# Patient Record
Sex: Male | Born: 1945
Health system: Southern US, Community
[De-identification: ages and names within clinical notes are randomized; demographics above are authoritative.]

## PROBLEM LIST (undated history)

## (undated) DIAGNOSIS — G47419 Narcolepsy without cataplexy: Secondary | ICD-10-CM

## (undated) DIAGNOSIS — J449 Chronic obstructive pulmonary disease, unspecified: Secondary | ICD-10-CM

## (undated) DIAGNOSIS — I10 Essential (primary) hypertension: Secondary | ICD-10-CM

## (undated) DIAGNOSIS — F4310 Post-traumatic stress disorder, unspecified: Secondary | ICD-10-CM

## (undated) DIAGNOSIS — R7303 Prediabetes: Secondary | ICD-10-CM

## (undated) DIAGNOSIS — J439 Emphysema, unspecified: Secondary | ICD-10-CM

## (undated) DIAGNOSIS — G629 Polyneuropathy, unspecified: Secondary | ICD-10-CM

## (undated) DIAGNOSIS — M1990 Unspecified osteoarthritis, unspecified site: Secondary | ICD-10-CM

## (undated) DIAGNOSIS — L409 Psoriasis, unspecified: Secondary | ICD-10-CM

## (undated) DIAGNOSIS — M51369 Other intervertebral disc degeneration, lumbar region without mention of lumbar back pain or lower extremity pain: Secondary | ICD-10-CM

## (undated) DIAGNOSIS — R519 Headache, unspecified: Secondary | ICD-10-CM

## (undated) DIAGNOSIS — R739 Hyperglycemia, unspecified: Secondary | ICD-10-CM

## (undated) DIAGNOSIS — J189 Pneumonia, unspecified organism: Secondary | ICD-10-CM

## (undated) DIAGNOSIS — E785 Hyperlipidemia, unspecified: Secondary | ICD-10-CM

## (undated) DIAGNOSIS — H269 Unspecified cataract: Secondary | ICD-10-CM

## (undated) DIAGNOSIS — R51 Headache: Secondary | ICD-10-CM

## (undated) DIAGNOSIS — M5136 Other intervertebral disc degeneration, lumbar region: Secondary | ICD-10-CM

## (undated) DIAGNOSIS — I77811 Abdominal aortic ectasia: Secondary | ICD-10-CM

## (undated) DIAGNOSIS — Z77098 Contact with and (suspected) exposure to other hazardous, chiefly nonmedicinal, chemicals: Secondary | ICD-10-CM

## (undated) DIAGNOSIS — I2510 Atherosclerotic heart disease of native coronary artery without angina pectoris: Secondary | ICD-10-CM

## (undated) DIAGNOSIS — K219 Gastro-esophageal reflux disease without esophagitis: Secondary | ICD-10-CM

## (undated) DIAGNOSIS — D7589 Other specified diseases of blood and blood-forming organs: Secondary | ICD-10-CM

## (undated) DIAGNOSIS — N400 Enlarged prostate without lower urinary tract symptoms: Secondary | ICD-10-CM

## (undated) HISTORY — DX: Atherosclerotic heart disease of native coronary artery without angina pectoris: I25.10

## (undated) HISTORY — DX: Post-traumatic stress disorder, unspecified: F43.10

## (undated) HISTORY — PX: REPLACEMENT TOTAL KNEE BILATERAL: SUR1225

## (undated) HISTORY — DX: Other specified diseases of blood and blood-forming organs: D75.89

## (undated) HISTORY — DX: Polyneuropathy, unspecified: G62.9

## (undated) HISTORY — DX: Gastro-esophageal reflux disease without esophagitis: K21.9

## (undated) HISTORY — DX: Other intervertebral disc degeneration, lumbar region: M51.36

## (undated) HISTORY — PX: COLONOSCOPY: SHX174

## (undated) HISTORY — PX: BACK SURGERY: SHX140

## (undated) HISTORY — DX: Hyperglycemia, unspecified: R73.9

## (undated) HISTORY — DX: Psoriasis, unspecified: L40.9

## (undated) HISTORY — DX: Other intervertebral disc degeneration, lumbar region without mention of lumbar back pain or lower extremity pain: M51.369

## (undated) HISTORY — DX: Unspecified osteoarthritis, unspecified site: M19.90

## (undated) HISTORY — PX: TONSILLECTOMY: SUR1361

## (undated) HISTORY — DX: Emphysema, unspecified: J43.9

## (undated) HISTORY — DX: Benign prostatic hyperplasia without lower urinary tract symptoms: N40.0

## (undated) HISTORY — DX: Unspecified cataract: H26.9

---

## 2002-03-20 ENCOUNTER — Encounter (INDEPENDENT_AMBULATORY_CARE_PROVIDER_SITE_OTHER): Payer: Self-pay | Admitting: Specialist

## 2002-03-20 ENCOUNTER — Ambulatory Visit (HOSPITAL_COMMUNITY): Admission: RE | Admit: 2002-03-20 | Discharge: 2002-03-20 | Payer: Self-pay | Admitting: *Deleted

## 2003-03-19 ENCOUNTER — Encounter: Admission: RE | Admit: 2003-03-19 | Discharge: 2003-03-23 | Payer: Self-pay | Admitting: Otolaryngology

## 2009-06-15 ENCOUNTER — Encounter: Admission: RE | Admit: 2009-06-15 | Discharge: 2009-06-15 | Payer: Self-pay | Admitting: Family Medicine

## 2009-06-29 ENCOUNTER — Ambulatory Visit: Payer: Self-pay | Admitting: Internal Medicine

## 2009-06-30 ENCOUNTER — Encounter: Payer: Self-pay | Admitting: Pulmonary Disease

## 2009-08-09 ENCOUNTER — Inpatient Hospital Stay (HOSPITAL_COMMUNITY): Admission: RE | Admit: 2009-08-09 | Discharge: 2009-08-10 | Payer: Self-pay | Admitting: Orthopedic Surgery

## 2011-01-09 ENCOUNTER — Inpatient Hospital Stay (HOSPITAL_COMMUNITY)
Admission: RE | Admit: 2011-01-09 | Discharge: 2011-01-11 | Payer: Self-pay | Source: Home / Self Care | Attending: Orthopedic Surgery | Admitting: Orthopedic Surgery

## 2011-01-09 LAB — COMPREHENSIVE METABOLIC PANEL
ALT: 36 U/L (ref 0–53)
AST: 29 U/L (ref 0–37)
Albumin: 4 g/dL (ref 3.5–5.2)
Alkaline Phosphatase: 79 U/L (ref 39–117)
BUN: 11 mg/dL (ref 6–23)
CO2: 26 mEq/L (ref 19–32)
Calcium: 9.4 mg/dL (ref 8.4–10.5)
Chloride: 109 mEq/L (ref 96–112)
Creatinine, Ser: 1.01 mg/dL (ref 0.4–1.5)
GFR calc Af Amer: >60 mL/min (ref >60)
GFR calc non Af Amer: >60 mL/min (ref >60)
Glucose, Bld: 121 mg/dL — ABNORMAL HIGH (ref 70–99)
Potassium: 4.4 mEq/L (ref 3.5–5.1)
Sodium: 141 mEq/L (ref 135–145)
Total Bilirubin: 0.5 mg/dL (ref 0.3–1.2)
Total Protein: 6.4 g/dL (ref 6.0–8.3)

## 2011-01-09 LAB — URINALYSIS, ROUTINE W REFLEX MICROSCOPIC
Bilirubin Urine: NEGATIVE
Hgb urine dipstick: NEGATIVE
Ketones, ur: NEGATIVE mg/dL
Nitrite: NEGATIVE
Protein, ur: NEGATIVE mg/dL
Specific Gravity, Urine: 1.022 (ref 1.005–1.030)
Urine Glucose, Fasting: NEGATIVE mg/dL
Urobilinogen, UA: 0.2 mg/dL (ref 0.0–1.0)
pH: 5.5 (ref 5.0–8.0)

## 2011-01-09 LAB — PROTIME-INR
INR: 0.95 (ref 0.00–1.49)
Prothrombin Time: 12.9 seconds (ref 11.6–15.2)

## 2011-01-09 LAB — DIFFERENTIAL
Basophils Absolute: 0 10*3/uL (ref 0.0–0.1)
Basophils Relative: 0 % (ref 0–1)
Eosinophils Absolute: 0.1 10*3/uL (ref 0.0–0.7)
Eosinophils Relative: 2 % (ref 0–5)
Lymphocytes Relative: 46 % (ref 12–46)
Lymphs Abs: 2.6 10*3/uL (ref 0.7–4.0)
Monocytes Absolute: 0.5 10*3/uL (ref 0.1–1.0)
Monocytes Relative: 10 % (ref 3–12)
Neutro Abs: 2.3 10*3/uL (ref 1.7–7.7)
Neutrophils Relative %: 42 % — ABNORMAL LOW (ref 43–77)

## 2011-01-09 LAB — CBC
HCT: 44.7 % (ref 39.0–52.0)
Hemoglobin: 15.6 g/dL (ref 13.0–17.0)
MCH: 34.2 pg — ABNORMAL HIGH (ref 26.0–34.0)
MCHC: 34.9 g/dL (ref 30.0–36.0)
MCV: 98 fL (ref 78.0–100.0)
Platelets: 172 10*3/uL (ref 150–400)
RBC: 4.56 MIL/uL (ref 4.22–5.81)
RDW: 12.6 % (ref 11.5–15.5)
WBC: 5.6 10*3/uL (ref 4.0–10.5)

## 2011-01-09 LAB — APTT: aPTT: 27 seconds (ref 24–37)

## 2011-01-09 LAB — SURGICAL PCR SCREEN
MRSA, PCR: NEGATIVE
Staphylococcus aureus: NEGATIVE

## 2011-01-10 ENCOUNTER — Encounter (INDEPENDENT_AMBULATORY_CARE_PROVIDER_SITE_OTHER): Payer: Self-pay | Admitting: Orthopedic Surgery

## 2011-01-11 LAB — CROSSMATCH
ABO/RH(D): O POS
Antibody Screen: NEGATIVE
Unit division: 0
Unit division: 0

## 2011-01-11 LAB — BASIC METABOLIC PANEL
BUN: 13 mg/dL (ref 6–23)
CO2: 26 mEq/L (ref 19–32)
Calcium: 8.2 mg/dL — ABNORMAL LOW (ref 8.4–10.5)
Chloride: 106 mEq/L (ref 96–112)
Creatinine, Ser: 1.04 mg/dL (ref 0.4–1.5)
GFR calc Af Amer: >60 mL/min (ref >60)
GFR calc non Af Amer: >60 mL/min (ref >60)
Glucose, Bld: 150 mg/dL — ABNORMAL HIGH (ref 70–99)
Potassium: 4.3 mEq/L (ref 3.5–5.1)
Sodium: 136 mEq/L (ref 135–145)

## 2011-01-11 LAB — URINALYSIS, ROUTINE W REFLEX MICROSCOPIC
Bilirubin Urine: NEGATIVE
Hgb urine dipstick: NEGATIVE
Ketones, ur: NEGATIVE mg/dL
Nitrite: NEGATIVE
Protein, ur: NEGATIVE mg/dL
Specific Gravity, Urine: 1.005 (ref 1.005–1.030)
Urine Glucose, Fasting: NEGATIVE mg/dL
Urobilinogen, UA: 0.2 mg/dL (ref 0.0–1.0)
pH: 6.5 (ref 5.0–8.0)

## 2011-01-11 LAB — CBC
HCT: 33.5 % — ABNORMAL LOW (ref 39.0–52.0)
Hemoglobin: 11.5 g/dL — ABNORMAL LOW (ref 13.0–17.0)
MCH: 33.7 pg (ref 26.0–34.0)
MCHC: 34.3 g/dL (ref 30.0–36.0)
MCV: 98.2 fL (ref 78.0–100.0)
Platelets: 151 10*3/uL (ref 150–400)
RBC: 3.41 MIL/uL — ABNORMAL LOW (ref 4.22–5.81)
RDW: 12.7 % (ref 11.5–15.5)
WBC: 6.5 10*3/uL (ref 4.0–10.5)

## 2011-01-16 LAB — CBC
HCT: 33.5 % — ABNORMAL LOW (ref 39.0–52.0)
Hemoglobin: 11.3 g/dL — ABNORMAL LOW (ref 13.0–17.0)
MCH: 33.2 pg (ref 26.0–34.0)
MCHC: 33.7 g/dL (ref 30.0–36.0)
MCV: 98.5 fL (ref 78.0–100.0)
Platelets: 147 10*3/uL — ABNORMAL LOW (ref 150–400)
RBC: 3.4 MIL/uL — ABNORMAL LOW (ref 4.22–5.81)
RDW: 12.6 % (ref 11.5–15.5)
WBC: 7.1 10*3/uL (ref 4.0–10.5)

## 2011-01-16 LAB — BASIC METABOLIC PANEL
BUN: 9 mg/dL (ref 6–23)
CO2: 25 mEq/L (ref 19–32)
Calcium: 8.4 mg/dL (ref 8.4–10.5)
Chloride: 108 mEq/L (ref 96–112)
Creatinine, Ser: 1.09 mg/dL (ref 0.4–1.5)
GFR calc Af Amer: >60 mL/min (ref >60)
GFR calc non Af Amer: >60 mL/min (ref >60)
Glucose, Bld: 151 mg/dL — ABNORMAL HIGH (ref 70–99)
Potassium: 4.3 mEq/L (ref 3.5–5.1)
Sodium: 140 mEq/L (ref 135–145)

## 2011-02-14 NOTE — Op Note (Signed)
  Joseph Decker, Joseph Decker                 ACCOUNT NO.:  [id number]  MEDICAL RECORD NO.:  [id number]          PATIENT TYPE:  INP  LOCATION:  5041                         FACILITY:  [facility]  PHYSICIAN:  Mila Homer. Sherlean Foot, M.D. DATE OF BIRTH:  Apr 25, 1946  DATE OF PROCEDURE:  01/09/2011 DATE OF DISCHARGE:                              OPERATIVE REPORT   SURGEON:  Mila Homer. Sherlean Foot, MD  ASSISTANT:  Altamese Cabal, PA-C and Skip Mayer, PA-C  ANESTHESIA:  General.  PREOPERATIVE DIAGNOSIS:  Left knee osteoarthritis.  POSTOPERATIVE DIAGNOSIS:  Left knee osteoarthritis.  PROCEDURE:  Left total knee arthroplasty.  INDICATIONS FOR PROCEDURE:  The patient is a 65 year old white male with failure of conservative measures for osteoarthritis of the knee. Informed consent was obtained.  DESCRIPTION OF PROCEDURE:  The patient was laid supine, administered general anesthesia.  Left leg was prepped and draped in the usual sterile fashion.  Midline incision was made with a #10 blade.  New blade was used to make a median parapatellar arthrotomy performed a synovectomy.  I then elevated the medial crest of the tibia, #10 blade used to elevate the MCL on the medial crest of the tibia around to the semimembranosus tendon.  I then used the extramedullary alignment system on the tibia to make perpendicular cut the anatomic axis of the tibia and 3 degrees posterior slope.  I resurfaced the patella with a clamp to fall recreating the 24-mm native thickness.  Drill and 3 lug holes for a 32-mm button.  Used the intramedullary system on the femur with a 3- degrees rotation, stenting the formal cutting block, placed in the anterior and posterior chamfer cuts.  I then placed the lamina alignment spreader in the knee and removed the ACL, PCL, medial and lateral menisci, and posterior condylar osteophytes.  I then chose a size 12.  I then finished the femur with a size E.  The femur with a size 5 and trialed with a 5  tibia E femur, 12 insert, 32 patella, had good flexion, extension, gap balance.  I then removed the trial components and copiously irrigated.  I then cemented in the components allowing the cement to harden in extension.  A left Hemovac coming out superolaterally and deep to the arthrotomy. Pain catheter coming out superomedially and superficially to the arthrotomy.  I let tourniquet down, obtained hemostasis, and copiously irrigated.  I then closed the arthrotomy with figure-of-eight #1 Vicryl sutures, buried with 0 Vicryl sutures and subcuticular with 2-0 Vicryl stitches.  I dressed with Xeroform dressing, sponges, sterile Webril, TED stocking.  COMPLICATIONS:  None.  DRAINS:  None.          ______________________________ Mila Homer. Sherlean Foot, M.D.     SDL/MEDQ  D:  01/09/2011  T:  01/10/2011  Job:  [id number]  Electronically Signed by Georgena Spurling M.D. on 02/14/2011 09:00:57 PM

## 2011-03-02 ENCOUNTER — Other Ambulatory Visit: Payer: Self-pay | Admitting: Orthopedic Surgery

## 2011-03-02 DIAGNOSIS — M545 Low back pain, unspecified: Secondary | ICD-10-CM

## 2011-03-07 ENCOUNTER — Ambulatory Visit
Admission: RE | Admit: 2011-03-07 | Discharge: 2011-03-07 | Disposition: A | Discharge status: Home / Self Care | Payer: Managed Care, Other (non HMO) | Source: Ambulatory Visit | Attending: Orthopedic Surgery | Admitting: Orthopedic Surgery

## 2011-03-07 DIAGNOSIS — M545 Low back pain: Secondary | ICD-10-CM

## 2011-03-08 ENCOUNTER — Other Ambulatory Visit: Payer: Self-pay | Admitting: Orthopedic Surgery

## 2011-03-08 DIAGNOSIS — M545 Low back pain: Secondary | ICD-10-CM

## 2011-03-09 ENCOUNTER — Ambulatory Visit
Admission: RE | Admit: 2011-03-09 | Discharge: 2011-03-09 | Disposition: A | Discharge status: Home / Self Care | Payer: Managed Care, Other (non HMO) | Source: Ambulatory Visit | Attending: Orthopedic Surgery | Admitting: Orthopedic Surgery

## 2011-03-09 DIAGNOSIS — M545 Low back pain: Secondary | ICD-10-CM

## 2011-03-16 ENCOUNTER — Other Ambulatory Visit: Payer: Managed Care, Other (non HMO)

## 2011-03-21 ENCOUNTER — Encounter (HOSPITAL_COMMUNITY)
Admission: RE | Admit: 2011-03-21 | Discharge: 2011-03-21 | Disposition: A | Discharge status: Home / Self Care | Payer: Managed Care, Other (non HMO) | Source: Ambulatory Visit | Attending: Neurosurgery | Admitting: Neurosurgery

## 2011-03-21 DIAGNOSIS — Z01812 Encounter for preprocedural laboratory examination: Secondary | ICD-10-CM | POA: Insufficient documentation

## 2011-03-21 LAB — CBC
HCT: 41.5 % (ref 39.0–52.0)
Hemoglobin: 14.6 g/dL (ref 13.0–17.0)
MCH: 33.6 pg (ref 26.0–34.0)
MCHC: 35.2 g/dL (ref 30.0–36.0)
MCV: 95.6 fL (ref 78.0–100.0)
Platelets: 214 10*3/uL (ref 150–400)
RBC: 4.34 MIL/uL (ref 4.22–5.81)
RDW: 12.2 % (ref 11.5–15.5)
WBC: 7.3 10*3/uL (ref 4.0–10.5)

## 2011-03-21 LAB — SURGICAL PCR SCREEN
MRSA, PCR: NEGATIVE
Staphylococcus aureus: NEGATIVE

## 2011-03-23 ENCOUNTER — Ambulatory Visit (HOSPITAL_COMMUNITY)
Admission: RE | Admit: 2011-03-23 | Discharge: 2011-03-24 | DRG: 491 | Disposition: A | Discharge status: Home / Self Care | Payer: Managed Care, Other (non HMO) | Source: Ambulatory Visit | Attending: Neurosurgery | Admitting: Neurosurgery

## 2011-03-23 ENCOUNTER — Inpatient Hospital Stay (HOSPITAL_COMMUNITY): Payer: Managed Care, Other (non HMO)

## 2011-03-23 DIAGNOSIS — M5126 Other intervertebral disc displacement, lumbar region: Secondary | ICD-10-CM | POA: Insufficient documentation

## 2011-03-23 DIAGNOSIS — M5137 Other intervertebral disc degeneration, lumbosacral region: Secondary | ICD-10-CM | POA: Insufficient documentation

## 2011-03-23 DIAGNOSIS — M47817 Spondylosis without myelopathy or radiculopathy, lumbosacral region: Secondary | ICD-10-CM | POA: Insufficient documentation

## 2011-03-23 DIAGNOSIS — M51379 Other intervertebral disc degeneration, lumbosacral region without mention of lumbar back pain or lower extremity pain: Secondary | ICD-10-CM | POA: Insufficient documentation

## 2011-03-23 DIAGNOSIS — K219 Gastro-esophageal reflux disease without esophagitis: Secondary | ICD-10-CM | POA: Insufficient documentation

## 2011-03-28 NOTE — Op Note (Signed)
Joseph Decker, Joseph Decker                 ACCOUNT NO.:  [id number]  MEDICAL RECORD NO.:  [id number]           PATIENT TYPE:  I  LOCATION:  3536                         FACILITY:  [facility]  PHYSICIAN:  Danae Orleans. Venetia Maxon, M.D.  DATE OF BIRTH:  07-16-1946  DATE OF PROCEDURE:  03/23/2011 DATE OF DISCHARGE:                              OPERATIVE REPORT   PREOPERATIVE DIAGNOSIS:  Left L3-L4 far lateral herniated lumbar disk with spondylosis, degenerative disk disease, and lumbar radiculopathy.  POSTOPERATIVE DIAGNOSIS:  Left L3-L4 far lateral herniated lumbar disk with spondylosis, degenerative disk disease, and lumbar radiculopathy.  PROCEDURE:  Left L3-L4 far lateral Metrix microdiskectomy with microdissection.  SURGEON:  Danae Orleans. Venetia Maxon, MD  ASSISTANTS: 1. Georgiann Cocker, RN 2. Hilda Lias, MD  ANESTHESIA:  General endotracheal anesthesia.  ESTIMATED BLOOD LOSS:  Minimal.  COMPLICATIONS:  None.  DISPOSITION:  Recovery.  INDICATIONS:  Joseph Decker is a 65 year old man with a large left L3-L4 far lateral herniated disk with severe left L3 nerve root compression. It has elected to perform a left L3-L4 Metrix far lateral microdiskectomy.  DESCRIPTION OF PROCEDURE:  Joseph Decker was brought to the operating room. Following satisfactory and uncomplicated induction of general endotracheal anesthesia plus intravenous lines, the patient was placed in prone position on operating table on the Wilson frame.  His low back was shaved, then prepped and draped in usual sterile fashion using C-arm from an AP projection.  The L3-L4 interspace was identified and area of planned incision was marked on the left side of midline, docking onto the left L3 pars.  Using sequential dilators, a 6-cm x 22-mm Metrix tubular retractor was placed.  Muscle was then taken down overlying the pars.  The pars was then thinned with a high-speed drill and the lateral aspect of the pars was removed with a 3-mm Kerrison  rongeur.  The microscope was brought into field.  The left L3 nerve root was identified, it was significantly displaced and using microdissection, the nerve root was mobilized.  Eventually, it was mobilized caudad to expose a very large amount of herniated disk material which was causing significant displacement and compression of the nerve root.  Multiple large fragments were removed of disk material and the nerve root was felt to be well decompressed at this point.  Ball hooks were easily inserted underneath the nerve root and along the course of the nerve root and also from an inferior projection.  There does not appear to be any residual disk herniation.  Wound was irrigated and bathed in Depo- Medrol and fentanyl.  Self-retaining retractor was removed.  The lumbodorsal fascia was closed with 0 Vicryl sutures, subcutaneous tissues were approximated with 2-0 Vicryl interrupted inverted sutures, skin edges were approximated with 3-0 Vicryl subcuticular stitch.  Wound was dressed with Dermabond.  The patient was extubated in the operating room, taken to recovery in stable and satisfactory condition, having tolerated his operation well.  Counts were correct at the end of the case.     Danae Orleans. Venetia Maxon, M.D.     JDS/MEDQ  D:  03/23/2011  T:  03/24/2011  Job:  [id number]  Electronically Signed by Maeola Harman M.D. on 03/28/2011 07:34:23 AM

## 2011-04-01 LAB — BASIC METABOLIC PANEL
BUN: 13 mg/dL (ref 6–23)
Calcium: 8.5 mg/dL (ref 8.4–10.5)
GFR calc non Af Amer: >60 mL/min (ref >60)
Glucose, Bld: 127 mg/dL — ABNORMAL HIGH (ref 70–99)
Sodium: 135 mEq/L (ref 135–145)

## 2011-04-01 LAB — COMPREHENSIVE METABOLIC PANEL
ALT: 23 U/L (ref 0–53)
Alkaline Phosphatase: 93 U/L (ref 39–117)
CO2: 25 mEq/L (ref 19–32)
GFR calc non Af Amer: >60 mL/min (ref >60)
Glucose, Bld: 95 mg/dL (ref 70–99)
Potassium: 4.2 mEq/L (ref 3.5–5.1)
Sodium: 137 mEq/L (ref 135–145)
Total Bilirubin: 0.6 mg/dL (ref 0.3–1.2)

## 2011-04-01 LAB — DIFFERENTIAL
Basophils Relative: 1 % (ref 0–1)
Eosinophils Absolute: 0.1 10*3/uL (ref 0.0–0.7)
Neutrophils Relative %: 56 % (ref 43–77)

## 2011-04-01 LAB — CROSSMATCH
ABO/RH(D): O POS
Antibody Screen: NEGATIVE

## 2011-04-01 LAB — CBC
Hemoglobin: 13.3 g/dL (ref 13.0–17.0)
Hemoglobin: 15.6 g/dL (ref 13.0–17.0)
Platelets: 158 10*3/uL (ref 150–400)
RBC: 4.45 MIL/uL (ref 4.22–5.81)
RDW: 12.9 % (ref 11.5–15.5)

## 2011-04-01 LAB — URINALYSIS, ROUTINE W REFLEX MICROSCOPIC
Bilirubin Urine: NEGATIVE
Hgb urine dipstick: NEGATIVE
Protein, ur: NEGATIVE mg/dL
Specific Gravity, Urine: 1.015 (ref 1.005–1.030)
Urobilinogen, UA: 0.2 mg/dL (ref 0.0–1.0)

## 2011-04-01 LAB — URINE CULTURE

## 2011-04-01 LAB — PROTIME-INR
INR: 0.9 (ref 0.00–1.49)
Prothrombin Time: 12 seconds (ref 11.6–15.2)

## 2011-04-01 LAB — ABO/RH: ABO/RH(D): O POS

## 2011-05-09 NOTE — Op Note (Signed)
Joseph Decker, Joseph Decker                 ACCOUNT NO.:  [id number]   MEDICAL RECORD NO.:  [id number]          PATIENT TYPE:  INP   LOCATION:  5005                         FACILITY:  [facility]   PHYSICIAN:  Mila Homer. Sherlean Foot, M.D. DATE OF BIRTH:  12-09-1946   DATE OF PROCEDURE:  08/09/2009  DATE OF DISCHARGE:                               OPERATIVE REPORT   SURGEON:  Mila Homer. Sherlean Foot, MD   ASSISTANTS:  1. Altamese Cabal, PA-C  2. Laural Benes. Su Hilt, PA-C   ANESTHESIA:  Spinal.   PREOPERATIVE DIAGNOSIS:  Right medial compartment osteoarthritis.   POSTOPERATIVE DIAGNOSIS:  Right medial compartment osteoarthritis.   PROCEDURE:  Right unicompartmental arthroplasty.   INDICATIONS FOR PROCEDURE:  The patient is a 65 year old gentleman who  comes in with failure of conservative measures for osteoarthritis of the  medial compartment of the right knee.  Informed consent was obtained.   DESCRIPTION OF PROCEDURE:  The patient was laid supine and administered  spinal anesthesia in the supine position.  Right leg was prepped and  draped in the usual sterile fashion.  The extremity was exsanguinated  with the Esmarch and tourniquet inflated to 350 mmHg.  A small midline  incision was made from the mid pole of the patella to the tibial  tubercle.  Sharp dissection was carried down through the hip joint  capsule.  I removed the posterior fat pad.  I then elevated the deep MCL  off the medial crest of the tibia and removed the medial meniscus.  I  then took off the anterior leading edge of the tibial plateau and then  put the leg in extension.  I used the extramedullary alignment guide,  pinned it in place, and took C-arm images to ensure that it was pinned  in the mechanical axis.  I then made a distal femoral cut and proximal  tibial cut.  I then removed the cutting blocks and sized to a size F on  the femur, pinned, and then made the posterior and chamfer cuts with  sagittal saw.  I then finished the  tibia with a size 3 tibial tray and  drilling for the lugs.  I then trialed with the F femur, 3 tibia, 8-9  inserts and chose the 9.  I then removed the trial components and  copiously irrigated.  I cemented in the components and removed back  excess cement.  I snapped in a 9 polyethylene while the cement hardened  in extension.  I then irrigated and obtained hemostasis.  I closed the  arthrotomy with figure-of-eight #1 Vicryl sutures, closed the deep soft  tissues with buried 0-Vicryl sutures, subcuticular 3-0 Vicryl stitches,  and skin staples.  I did infiltrate the capsule with Marcaine 0.25% and  left in a pain catheter just outside the arthrotomy.   COMPLICATIONS:  None.   DRAINS:  None.   ESTIMATED BLOOD LOSS:  100 mL.           ______________________________  Mila Homer. Sherlean Foot, M.D.     SDL/MEDQ  D:  08/09/2009  T:  08/09/2009  Job:  [id number]

## 2011-08-22 ENCOUNTER — Encounter: Payer: Self-pay | Admitting: Orthopedic Surgery

## 2011-08-26 ENCOUNTER — Encounter: Payer: Self-pay | Admitting: Orthopedic Surgery

## 2011-09-25 ENCOUNTER — Encounter: Payer: Self-pay | Admitting: Orthopedic Surgery

## 2012-03-15 DIAGNOSIS — D7589 Other specified diseases of blood and blood-forming organs: Secondary | ICD-10-CM | POA: Diagnosis not present

## 2012-05-13 DIAGNOSIS — N529 Male erectile dysfunction, unspecified: Secondary | ICD-10-CM | POA: Diagnosis not present

## 2012-05-13 DIAGNOSIS — N401 Enlarged prostate with lower urinary tract symptoms: Secondary | ICD-10-CM | POA: Diagnosis not present

## 2012-05-13 DIAGNOSIS — R3129 Other microscopic hematuria: Secondary | ICD-10-CM | POA: Diagnosis not present

## 2012-07-05 DIAGNOSIS — N390 Urinary tract infection, site not specified: Secondary | ICD-10-CM | POA: Diagnosis not present

## 2012-07-05 DIAGNOSIS — R350 Frequency of micturition: Secondary | ICD-10-CM | POA: Diagnosis not present

## 2012-11-01 DIAGNOSIS — D7589 Other specified diseases of blood and blood-forming organs: Secondary | ICD-10-CM | POA: Diagnosis not present

## 2013-03-07 DIAGNOSIS — E782 Mixed hyperlipidemia: Secondary | ICD-10-CM | POA: Diagnosis not present

## 2013-03-07 DIAGNOSIS — F411 Generalized anxiety disorder: Secondary | ICD-10-CM | POA: Diagnosis not present

## 2013-03-07 DIAGNOSIS — Z0000 Encounter for general adult medical examination without abnormal findings: Secondary | ICD-10-CM | POA: Diagnosis not present

## 2013-03-07 DIAGNOSIS — Z79899 Other long term (current) drug therapy: Secondary | ICD-10-CM | POA: Diagnosis not present

## 2013-03-07 DIAGNOSIS — N400 Enlarged prostate without lower urinary tract symptoms: Secondary | ICD-10-CM | POA: Diagnosis not present

## 2013-03-07 DIAGNOSIS — Z6834 Body mass index (BMI) 34.0-34.9, adult: Secondary | ICD-10-CM | POA: Diagnosis not present

## 2013-04-04 DIAGNOSIS — R3129 Other microscopic hematuria: Secondary | ICD-10-CM | POA: Diagnosis not present

## 2013-05-22 DIAGNOSIS — N401 Enlarged prostate with lower urinary tract symptoms: Secondary | ICD-10-CM | POA: Diagnosis not present

## 2013-05-22 DIAGNOSIS — R3129 Other microscopic hematuria: Secondary | ICD-10-CM | POA: Diagnosis not present

## 2013-05-22 DIAGNOSIS — N529 Male erectile dysfunction, unspecified: Secondary | ICD-10-CM | POA: Diagnosis not present

## 2014-01-13 DIAGNOSIS — Z96659 Presence of unspecified artificial knee joint: Secondary | ICD-10-CM | POA: Diagnosis not present

## 2014-01-13 DIAGNOSIS — M25469 Effusion, unspecified knee: Secondary | ICD-10-CM | POA: Diagnosis not present

## 2014-01-13 DIAGNOSIS — M25569 Pain in unspecified knee: Secondary | ICD-10-CM | POA: Diagnosis not present

## 2014-03-19 DIAGNOSIS — J301 Allergic rhinitis due to pollen: Secondary | ICD-10-CM | POA: Diagnosis not present

## 2014-03-19 DIAGNOSIS — L408 Other psoriasis: Secondary | ICD-10-CM | POA: Diagnosis not present

## 2014-03-19 DIAGNOSIS — Z79899 Other long term (current) drug therapy: Secondary | ICD-10-CM | POA: Diagnosis not present

## 2014-03-19 DIAGNOSIS — E782 Mixed hyperlipidemia: Secondary | ICD-10-CM | POA: Diagnosis not present

## 2014-03-19 DIAGNOSIS — Z125 Encounter for screening for malignant neoplasm of prostate: Secondary | ICD-10-CM | POA: Diagnosis not present

## 2014-03-19 DIAGNOSIS — F411 Generalized anxiety disorder: Secondary | ICD-10-CM | POA: Diagnosis not present

## 2014-03-19 DIAGNOSIS — D7589 Other specified diseases of blood and blood-forming organs: Secondary | ICD-10-CM | POA: Diagnosis not present

## 2014-03-19 DIAGNOSIS — Z0000 Encounter for general adult medical examination without abnormal findings: Secondary | ICD-10-CM | POA: Diagnosis not present

## 2014-03-26 DIAGNOSIS — L570 Actinic keratosis: Secondary | ICD-10-CM | POA: Diagnosis not present

## 2014-03-26 DIAGNOSIS — L909 Atrophic disorder of skin, unspecified: Secondary | ICD-10-CM | POA: Diagnosis not present

## 2014-03-26 DIAGNOSIS — L821 Other seborrheic keratosis: Secondary | ICD-10-CM | POA: Diagnosis not present

## 2014-03-26 DIAGNOSIS — L919 Hypertrophic disorder of the skin, unspecified: Secondary | ICD-10-CM | POA: Diagnosis not present

## 2014-03-26 DIAGNOSIS — L408 Other psoriasis: Secondary | ICD-10-CM | POA: Diagnosis not present

## 2014-04-03 ENCOUNTER — Telehealth: Payer: Self-pay | Admitting: Hematology & Oncology

## 2014-04-03 DIAGNOSIS — R7309 Other abnormal glucose: Secondary | ICD-10-CM | POA: Diagnosis not present

## 2014-04-03 NOTE — Telephone Encounter (Signed)
[name] in medical records [name] aware of referral.

## 2014-04-06 ENCOUNTER — Telehealth: Payer: Self-pay | Admitting: Hematology and Oncology

## 2014-04-06 NOTE — Telephone Encounter (Signed)
C/D 04/06/14 for appt. 04/28/14

## 2014-04-06 NOTE — Telephone Encounter (Signed)
S/W PATIENT AND GAVE NEW PATIENT APPT FOR 05/05 @ 8 W/DR. GORSUCH REFERRING DR. [place], ABN FREE LIGHT CHAIN WELCOME PACKET MAILED.

## 2014-04-28 ENCOUNTER — Ambulatory Visit (HOSPITAL_BASED_OUTPATIENT_CLINIC_OR_DEPARTMENT_OTHER): Payer: BC Managed Care – PPO | Admitting: Hematology and Oncology

## 2014-04-28 ENCOUNTER — Telehealth: Payer: Self-pay | Admitting: Hematology and Oncology

## 2014-04-28 ENCOUNTER — Encounter: Payer: Self-pay | Admitting: Hematology and Oncology

## 2014-04-28 ENCOUNTER — Ambulatory Visit: Payer: Medicare Other

## 2014-04-28 VITALS — BP 148/80 | HR 80 | Temp 97.3°F | Resp 20 | Ht 74.0 in | Wt 243.3 lb

## 2014-04-28 DIAGNOSIS — D7589 Other specified diseases of blood and blood-forming organs: Secondary | ICD-10-CM | POA: Diagnosis not present

## 2014-04-28 DIAGNOSIS — G609 Hereditary and idiopathic neuropathy, unspecified: Secondary | ICD-10-CM

## 2014-04-28 DIAGNOSIS — D472 Monoclonal gammopathy: Secondary | ICD-10-CM | POA: Diagnosis not present

## 2014-04-28 DIAGNOSIS — G5793 Unspecified mononeuropathy of bilateral lower limbs: Secondary | ICD-10-CM | POA: Insufficient documentation

## 2014-04-28 DIAGNOSIS — R768 Other specified abnormal immunological findings in serum: Secondary | ICD-10-CM

## 2014-04-28 DIAGNOSIS — R718 Other abnormality of red blood cells: Secondary | ICD-10-CM | POA: Insufficient documentation

## 2014-04-28 DIAGNOSIS — R799 Abnormal finding of blood chemistry, unspecified: Secondary | ICD-10-CM

## 2014-04-28 NOTE — Telephone Encounter (Signed)
per pof to sch pt appt/adv [facility] will call and make Bone Survey Appt-pt understood-gave copy of sch

## 2014-04-28 NOTE — Progress Notes (Signed)
[facility] CONSULT NOTE  Patient Care Team: Lilian Coma, MD as PCP - General (Family Medicine) Lilian Coma, MD as Referring Physician (Family Medicine)  CHIEF COMPLAINTS/PURPOSE OF CONSULTATION:  High MCV, elevated light chain  HISTORY OF PRESENTING ILLNESS:  Joseph Decker 68 y.o. male is here because of abnormal blood work detected on routine visit. Review all of his CBC from 2012 were within normal limits. Most recently, he has several blood work which showed that his MCV range from 99-103. He has extensive workup which show elevated light chain without elevated immunoglobulin levels or detectable M spike on serum protein electrophoresis. He denies history of abnormal bone pain or bone fracture. Patient denies history of recurrent infection or atypical infections such as shingles of meningitis. Denies chills, night sweats, anorexia or abnormal weight loss. The patient has intentional weight loss of 5 pounds over the last 3 weeks due to  intentional dietary modification. Has very mild sensory neuropathy at the tips of his toes.  MEDICAL HISTORY:  Past Medical History  Diagnosis Date  . Hyperglycemia   . Macrocytosis without anemia   . Arthritis   . Enlarged prostate   . Psoriasis     SURGICAL HISTORY: Past Surgical History  Procedure Laterality Date  . Back surgery    . Replacement total knee bilateral      SOCIAL HISTORY: History   Social History  . Marital Status: Single    Spouse Name: N/A    Number of Children: N/A  . Years of Education: N/A   Occupational History  . Not on file.   Social History Main Topics  . Smoking status: Former Smoker -- 1.00 packs/day for 40 years  . Smokeless tobacco: Never Used  . Alcohol Use: No  . Drug Use: No  . Sexual Activity: Not on file   Other Topics Concern  . Not on file   Social History Narrative  . No narrative on file    FAMILY HISTORY: History reviewed. No pertinent family  history.  ALLERGIES:  is allergic to antihistamines, chlorpheniramine-type and oxycodone.  MEDICATIONS:  Current Outpatient Prescriptions  Medication Sig Dispense Refill  . tamsulosin (FLOMAX) 0.4 MG CAPS capsule Take 0.4 mg by mouth daily.       No current facility-administered medications for this visit.    REVIEW OF SYSTEMS:   Eyes: Denies blurriness of vision, double vision or watery eyes Ears, nose, mouth, throat, and face: Denies mucositis or sore throat Respiratory: Denies cough, dyspnea or wheezes Cardiovascular: Denies palpitation, chest discomfort or lower extremity swelling Gastrointestinal:  Denies nausea, heartburn or change in bowel habits Skin: Denies abnormal skin rashes Lymphatics: Denies new lymphadenopathy or easy bruising Neurological:Denies numbness, tingling or new weaknesses Behavioral/Psych: Mood is stable, no new changes  All other systems were reviewed with the patient and are negative.  PHYSICAL EXAMINATION: ECOG PERFORMANCE STATUS: 0 - Asymptomatic  Filed Vitals:   04/28/14 0818  BP: 148/80  Pulse: 80  Temp: 97.3 F (36.3 C)  Resp: 20   Filed Weights   04/28/14 0818  Weight: 243 lb 4.8 oz (110.36 kg)    GENERAL:alert, no distress and comfortable SKIN: skin color, texture, turgor are normal, no rashes or significant lesions EYES: normal, conjunctiva are pink and non-injected, sclera clear OROPHARYNX:no exudate, no erythema and lips, buccal mucosa, and tongue normal  NECK: supple, thyroid normal size, non-tender, without nodularity LYMPH:  no palpable lymphadenopathy in the cervical, axillary or inguinal LUNGS: clear to auscultation  and percussion with normal breathing effort HEART: regular rate & rhythm and no murmurs and no lower extremity edema ABDOMEN:abdomen soft, non-tender and normal bowel sounds Musculoskeletal:no cyanosis of digits and no clubbing  PSYCH: alert & oriented x 3 with fluent speech NEURO: no focal motor/sensory  deficits  LABORATORY DATA: I reviewed the scans electronic records from his physician office. I have reviewed the data as listed Lab Results  Component Value Date   WBC 7.3 03/21/2011   HGB 14.6 03/21/2011   HCT 41.5 03/21/2011   MCV 95.6 03/21/2011   PLT 214 03/21/2011   ASSESSMENT:  We discussed the approach for elevated MCV and elevated light chains.  PLAN:  #1 MGUS To rule out multiple myeloma, I recommend complete blood work, 24 hour urine collection for UPEP and skeletal survey to rule out multiple myeloma Depending on test results, we may or may not proceed with bone marrow aspirate and biopsy. #2 elevated MCV with mild sensory neuropathy I will check a vitamin B 12 level.   Orders Placed This Encounter  Procedures  . DG Bone Survey Met    Standing Status: Future     Number of Occurrences:      Standing Expiration Date: 06/28/2015    Order Specific Question:  Reason for Exam (SYMPTOM  OR DIAGNOSIS REQUIRED)    Answer:  staging myeloma    Order Specific Question:  Preferred imaging location?    Answer:  [facility]  . CBC with Differential    Standing Status: Future     Number of Occurrences:      Standing Expiration Date: 06/28/2015  . Comprehensive metabolic panel    Standing Status: Future     Number of Occurrences:      Standing Expiration Date: 06/28/2015  . Lactate dehydrogenase    Standing Status: Future     Number of Occurrences:      Standing Expiration Date: 06/28/2015  . SPEP & IFE with QIG    Standing Status: Future     Number of Occurrences:      Standing Expiration Date: 06/28/2015  . Kappa/lambda light chains    Standing Status: Future     Number of Occurrences:      Standing Expiration Date: 06/28/2015  . Protein Electro, 24-Hour Urine    Standing Status: Future     Number of Occurrences:      Standing Expiration Date: 06/28/2015  . Beta 2 microglobulin, serum    Standing Status: Future     Number of Occurrences:      Standing Expiration Date:  06/28/2015  . IFE, Urine (with Tot Prot)    Standing Status: Future     Number of Occurrences:      Standing Expiration Date: 06/28/2015  . Vitamin B12    Standing Status: Future     Number of Occurrences:      Standing Expiration Date: 06/28/2015  . Morphology    Standing Status: Future     Number of Occurrences:      Standing Expiration Date: 06/28/2015    All questions were answered. The patient knows to call the clinic with any problems, questions or concerns. I spent 40 minutes counseling the patient face to face. The total time spent in the appointment was 55 minutes and more than 50% was on counseling.     Heath Lark, MD 04/28/2014 9:46 AM

## 2014-04-29 DIAGNOSIS — N529 Male erectile dysfunction, unspecified: Secondary | ICD-10-CM | POA: Diagnosis not present

## 2014-04-29 DIAGNOSIS — N401 Enlarged prostate with lower urinary tract symptoms: Secondary | ICD-10-CM | POA: Diagnosis not present

## 2014-04-29 DIAGNOSIS — N138 Other obstructive and reflux uropathy: Secondary | ICD-10-CM | POA: Diagnosis not present

## 2014-04-30 DIAGNOSIS — E1129 Type 2 diabetes mellitus with other diabetic kidney complication: Secondary | ICD-10-CM | POA: Diagnosis not present

## 2014-04-30 DIAGNOSIS — E1165 Type 2 diabetes mellitus with hyperglycemia: Secondary | ICD-10-CM | POA: Diagnosis not present

## 2014-04-30 DIAGNOSIS — N182 Chronic kidney disease, stage 2 (mild): Secondary | ICD-10-CM | POA: Diagnosis not present

## 2014-05-11 ENCOUNTER — Ambulatory Visit (HOSPITAL_COMMUNITY)
Admission: RE | Admit: 2014-05-11 | Discharge: 2014-05-11 | Disposition: A | Discharge status: Home / Self Care | Payer: BC Managed Care – PPO | Source: Ambulatory Visit | Attending: Hematology and Oncology | Admitting: Hematology and Oncology

## 2014-05-11 ENCOUNTER — Other Ambulatory Visit (HOSPITAL_BASED_OUTPATIENT_CLINIC_OR_DEPARTMENT_OTHER): Payer: BC Managed Care – PPO

## 2014-05-11 DIAGNOSIS — R718 Other abnormality of red blood cells: Secondary | ICD-10-CM | POA: Diagnosis not present

## 2014-05-11 DIAGNOSIS — D7589 Other specified diseases of blood and blood-forming organs: Secondary | ICD-10-CM

## 2014-05-11 DIAGNOSIS — D472 Monoclonal gammopathy: Secondary | ICD-10-CM

## 2014-05-11 DIAGNOSIS — R799 Abnormal finding of blood chemistry, unspecified: Secondary | ICD-10-CM | POA: Diagnosis not present

## 2014-05-11 DIAGNOSIS — G609 Hereditary and idiopathic neuropathy, unspecified: Secondary | ICD-10-CM

## 2014-05-11 LAB — CBC WITH DIFFERENTIAL/PLATELET
BASO%: 0.8 % (ref 0.0–2.0)
BASOS ABS: 0 10*3/uL (ref 0.0–0.1)
EOS ABS: 0.1 10*3/uL (ref 0.0–0.5)
EOS%: 1.7 % (ref 0.0–7.0)
HCT: 43.9 % (ref 38.4–49.9)
HEMOGLOBIN: 15 g/dL (ref 13.0–17.1)
LYMPH%: 44.6 % (ref 14.0–49.0)
MCH: 35.2 pg — AB (ref 27.2–33.4)
MCHC: 34.2 g/dL (ref 32.0–36.0)
MCV: 102.7 fL — AB (ref 79.3–98.0)
MONO#: 0.6 10*3/uL (ref 0.1–0.9)
MONO%: 11 % (ref 0.0–14.0)
NEUT%: 41.9 % (ref 39.0–75.0)
NEUTROS ABS: 2.4 10*3/uL (ref 1.5–6.5)
PLATELETS: 185 10*3/uL (ref 140–400)
RBC: 4.28 10*6/uL (ref 4.20–5.82)
RDW: 13.1 % (ref 11.0–14.6)
WBC: 5.7 10*3/uL (ref 4.0–10.3)
lymph#: 2.6 10*3/uL (ref 0.9–3.3)

## 2014-05-11 LAB — MORPHOLOGY: PLT EST: ADEQUATE

## 2014-05-11 LAB — COMPREHENSIVE METABOLIC PANEL (CC13)
ALK PHOS: 72 U/L (ref 40–150)
ALT: 54 U/L (ref 0–55)
AST: 49 U/L — AB (ref 5–34)
Albumin: 3.8 g/dL (ref 3.5–5.0)
Anion Gap: 10 mEq/L (ref 3–11)
BILIRUBIN TOTAL: 0.55 mg/dL (ref 0.20–1.20)
BUN: 22.2 mg/dL (ref 7.0–26.0)
CO2: 22 mEq/L (ref 22–29)
CREATININE: 1 mg/dL (ref 0.7–1.3)
Calcium: 9.2 mg/dL (ref 8.4–10.4)
Chloride: 109 mEq/L (ref 98–109)
GLUCOSE: 115 mg/dL (ref 70–140)
Potassium: 4 mEq/L (ref 3.5–5.1)
Sodium: 141 mEq/L (ref 136–145)
Total Protein: 6.9 g/dL (ref 6.4–8.3)

## 2014-05-11 LAB — LACTATE DEHYDROGENASE (CC13): LDH: 216 U/L (ref 125–245)

## 2014-05-13 LAB — UIFE/LIGHT CHAINS/TP QN, 24-HR UR
ALPHA 2 UR: DETECTED — AB
Albumin, U: DETECTED
Alpha 1, Urine: DETECTED — AB
Beta, Urine: DETECTED — AB
FREE KAPPA/LAMBDA RATIO: 5.7 ratio (ref 2.04–10.37)
FREE LAMBDA LT CHAINS, UR: 0.1 mg/dL (ref 0.02–0.67)
FREE LT CHN EXCR RATE: 7.41 mg/d
Free Kappa Lt Chains,Ur: 0.57 mg/dL (ref 0.14–2.42)
Free Lambda Excretion/Day: 1.3 mg/d
Gamma Globulin, Urine: DETECTED — AB
TOTAL PROTEIN, URINE-UPE24: 1.5 mg/dL
TOTAL PROTEIN, URINE-UR/DAY: 20 mg/d (ref 10–140)
Time: 24 hours
Volume, Urine: 1300 mL

## 2014-05-13 LAB — SPEP & IFE WITH QIG
ALPHA-2-GLOBULIN: 8.9 % (ref 7.1–11.8)
Albumin ELP: 61.1 % (ref 55.8–66.1)
Alpha-1-Globulin: 3.3 % (ref 2.9–4.9)
Beta 2: 5.3 % (ref 3.2–6.5)
Beta Globulin: 7.1 % (ref 4.7–7.2)
GAMMA GLOBULIN: 14.3 % (ref 11.1–18.8)
IGA: 341 mg/dL (ref 68–379)
IGG (IMMUNOGLOBIN G), SERUM: 1010 mg/dL (ref 650–1600)
IGM, SERUM: 80 mg/dL (ref 41–251)
Total Protein, Serum Electrophoresis: 6.6 g/dL (ref 6.0–8.3)

## 2014-05-13 LAB — VITAMIN B12: Vitamin B-12: 800 pg/mL (ref 211–911)

## 2014-05-13 LAB — BETA 2 MICROGLOBULIN, SERUM: BETA 2 MICROGLOBULIN: 1.99 mg/L (ref <=2.51)

## 2014-05-13 LAB — UPEP/TP, 24-HR URINE
COLLECTION INTERVAL: 24 h
Total Protein, Urine/Day: 52 mg/d (ref 50–100)
Total Protein, Urine: 4 mg/dL
Total Volume, Urine: 1300 mL

## 2014-05-13 LAB — KAPPA/LAMBDA LIGHT CHAINS
KAPPA FREE LGHT CHN: 2.62 mg/dL — AB (ref 0.33–1.94)
Kappa:Lambda Ratio: 2.22 — ABNORMAL HIGH (ref 0.26–1.65)
Lambda Free Lght Chn: 1.18 mg/dL (ref 0.57–2.63)

## 2014-05-19 ENCOUNTER — Ambulatory Visit: Payer: BC Managed Care – PPO | Admitting: Hematology and Oncology

## 2014-05-20 ENCOUNTER — Telehealth: Payer: Self-pay | Admitting: Hematology and Oncology

## 2014-05-20 NOTE — Telephone Encounter (Signed)
returned pt called lvm and r/s appt to 6.16 per pt request on vm.Marland KitchenMarland KitchenMarland Kitchen

## 2014-05-21 ENCOUNTER — Ambulatory Visit: Payer: BC Managed Care – PPO | Admitting: Hematology and Oncology

## 2014-05-25 ENCOUNTER — Ambulatory Visit: Payer: BC Managed Care – PPO | Admitting: Hematology and Oncology

## 2014-06-04 ENCOUNTER — Telehealth: Payer: Self-pay | Admitting: Hematology and Oncology

## 2014-06-04 NOTE — Telephone Encounter (Signed)
returned pt call and r/s appt per pt request...pt ok adna ware

## 2014-06-09 ENCOUNTER — Ambulatory Visit: Payer: BC Managed Care – PPO | Admitting: Hematology and Oncology

## 2014-07-07 ENCOUNTER — Ambulatory Visit: Payer: BC Managed Care – PPO | Admitting: *Deleted

## 2014-07-08 ENCOUNTER — Encounter: Payer: Self-pay | Admitting: Hematology and Oncology

## 2014-07-08 ENCOUNTER — Ambulatory Visit (HOSPITAL_BASED_OUTPATIENT_CLINIC_OR_DEPARTMENT_OTHER): Payer: BC Managed Care – PPO | Admitting: Hematology and Oncology

## 2014-07-08 VITALS — BP 135/82 | HR 80 | Temp 97.6°F | Resp 18 | Ht 74.0 in | Wt 237.7 lb

## 2014-07-08 DIAGNOSIS — M949 Disorder of cartilage, unspecified: Secondary | ICD-10-CM

## 2014-07-08 DIAGNOSIS — M899 Disorder of bone, unspecified: Secondary | ICD-10-CM

## 2014-07-08 DIAGNOSIS — R768 Other specified abnormal immunological findings in serum: Secondary | ICD-10-CM

## 2014-07-08 DIAGNOSIS — R718 Other abnormality of red blood cells: Secondary | ICD-10-CM | POA: Diagnosis not present

## 2014-07-08 DIAGNOSIS — R894 Abnormal immunological findings in specimens from other organs, systems and tissues: Secondary | ICD-10-CM | POA: Diagnosis not present

## 2014-07-08 NOTE — Assessment & Plan Note (Signed)
Again, this is of no significance. Serum vitamin B12 is within normal limits. I noted very mild elevated liver function tests that could cause elevated MCV. I recommend observation only.

## 2014-07-08 NOTE — Progress Notes (Signed)
[facility] OFFICE PROGRESS NOTE  Lilian Coma, MD SUMMARY OF HEMATOLOGIC HISTORY: Joseph Decker 68 y.o. male is here because of abnormal blood work detected on routine visit. Review all of his CBC from 2012 were within normal limits. Most recently, he has several blood work which showed that his MCV range from 99-103. He has extensive workup which show elevated light chain without elevated immunoglobulin levels or detectable M spike on serum protein electrophoresis. He denies history of abnormal bone pain or bone fracture. Patient denies history of recurrent infection or atypical infections such as shingles of meningitis. Denies chills, night sweats, anorexia or abnormal weight loss. The patient has intentional weight loss of 5 pounds over the last 3 weeks due to  intentional dietary modification. Has very mild sensory neuropathy at the tips of his toes. Extensive workup in May of 2015 show no evidence of multiple myeloma. INTERVAL HISTORY: Joseph Decker 68 y.o. male returns for further followup. He is still losing weight and is attempting to lose more weight with dietary modification.  I have reviewed the past medical history, past surgical history, social history and family history with the patient and they are unchanged from previous note.  ALLERGIES:  is allergic to antihistamines, chlorpheniramine-type and oxycodone.  MEDICATIONS:  Current Outpatient Prescriptions  Medication Sig Dispense Refill  . tamsulosin (FLOMAX) 0.4 MG CAPS capsule Take 0.4 mg by mouth daily.       No current facility-administered medications for this visit.     REVIEW OF SYSTEMS:   All other systems were reviewed with the patient and are negative.  PHYSICAL EXAMINATION: ECOG PERFORMANCE STATUS: 0 - Asymptomatic  Filed Vitals:   07/08/14 0903  BP: 135/82  Pulse: 80  Temp: 97.6 F (36.4 C)  Resp: 18   Filed Weights   07/08/14 0903  Weight: 237 lb 11.2 oz (107.82 kg)     GENERAL:alert, no distress and comfortable. His moderately obese SKIN: skin color, texture, turgor are normal, no rashes or significant lesions. Noticed psoriasis lesions. Musculoskeletal:no cyanosis of digits and no clubbing  NEURO: alert & oriented x 3 with fluent speech, no focal motor/sensory deficits  LABORATORY DATA:  I have reviewed the data as listed No results found for this or any previous visit (from the past 48 hour(s)).  Lab Results  Component Value Date   WBC 5.7 05/11/2014   HGB 15.0 05/11/2014   HCT 43.9 05/11/2014   MCV 102.7* 05/11/2014   PLT 185 05/11/2014    RADIOGRAPHIC STUDIES: I reviewed her skeletal survey. The very small 4 mm lesion noted on the left scapula is of unknown significance. I have personally reviewed the radiological images as listed and agreed with the findings in the report.  ASSESSMENT & PLAN:  Elevated serum immunoglobulin free light chains Extensive workup has not revealed evidence of multiple myeloma. To mildly elevated serum light chain is likely related to his psoriasis. I do not recommend any further workup.   Elevated MCV Again, this is of no significance. Serum vitamin B12 is within normal limits. I noted very mild elevated liver function tests that could cause elevated MCV. I recommend observation only.      All questions were answered. The patient knows to call the clinic with any problems, questions or concerns. No barriers to learning was detected.  I spent 15 minutes counseling the patient face to face. The total time spent in the appointment was 20 minutes and more than 50% was on counseling.  [place], [place], MD 07/08/2014 1:39 PM

## 2014-07-08 NOTE — Assessment & Plan Note (Signed)
Extensive workup has not revealed evidence of multiple myeloma. To mildly elevated serum light chain is likely related to his psoriasis. I do not recommend any further workup.

## 2014-09-25 DIAGNOSIS — L400 Psoriasis vulgaris: Secondary | ICD-10-CM | POA: Diagnosis not present

## 2014-10-06 DIAGNOSIS — J209 Acute bronchitis, unspecified: Secondary | ICD-10-CM | POA: Diagnosis not present

## 2015-04-15 DIAGNOSIS — N182 Chronic kidney disease, stage 2 (mild): Secondary | ICD-10-CM | POA: Diagnosis not present

## 2015-04-15 DIAGNOSIS — Z23 Encounter for immunization: Secondary | ICD-10-CM | POA: Diagnosis not present

## 2015-04-15 DIAGNOSIS — E782 Mixed hyperlipidemia: Secondary | ICD-10-CM | POA: Diagnosis not present

## 2015-04-15 DIAGNOSIS — E1122 Type 2 diabetes mellitus with diabetic chronic kidney disease: Secondary | ICD-10-CM | POA: Diagnosis not present

## 2015-04-15 DIAGNOSIS — Z79899 Other long term (current) drug therapy: Secondary | ICD-10-CM | POA: Diagnosis not present

## 2015-05-06 DIAGNOSIS — N400 Enlarged prostate without lower urinary tract symptoms: Secondary | ICD-10-CM | POA: Diagnosis not present

## 2015-05-06 DIAGNOSIS — N5201 Erectile dysfunction due to arterial insufficiency: Secondary | ICD-10-CM | POA: Diagnosis not present

## 2015-05-11 DIAGNOSIS — N400 Enlarged prostate without lower urinary tract symptoms: Secondary | ICD-10-CM | POA: Diagnosis not present

## 2015-05-11 DIAGNOSIS — Z1389 Encounter for screening for other disorder: Secondary | ICD-10-CM | POA: Diagnosis not present

## 2015-05-11 DIAGNOSIS — R7309 Other abnormal glucose: Secondary | ICD-10-CM | POA: Diagnosis not present

## 2015-05-11 DIAGNOSIS — L409 Psoriasis, unspecified: Secondary | ICD-10-CM | POA: Diagnosis not present

## 2015-05-11 DIAGNOSIS — Z6830 Body mass index (BMI) 30.0-30.9, adult: Secondary | ICD-10-CM | POA: Diagnosis not present

## 2015-05-11 DIAGNOSIS — D7589 Other specified diseases of blood and blood-forming organs: Secondary | ICD-10-CM | POA: Diagnosis not present

## 2015-09-29 DIAGNOSIS — R7309 Other abnormal glucose: Secondary | ICD-10-CM | POA: Diagnosis not present

## 2015-09-29 DIAGNOSIS — L409 Psoriasis, unspecified: Secondary | ICD-10-CM | POA: Diagnosis not present

## 2015-09-29 DIAGNOSIS — Z23 Encounter for immunization: Secondary | ICD-10-CM | POA: Diagnosis not present

## 2015-09-29 DIAGNOSIS — Z6829 Body mass index (BMI) 29.0-29.9, adult: Secondary | ICD-10-CM | POA: Diagnosis not present

## 2015-09-29 DIAGNOSIS — M179 Osteoarthritis of knee, unspecified: Secondary | ICD-10-CM | POA: Diagnosis not present

## 2015-09-29 DIAGNOSIS — D7589 Other specified diseases of blood and blood-forming organs: Secondary | ICD-10-CM | POA: Diagnosis not present

## 2016-02-22 DIAGNOSIS — R7309 Other abnormal glucose: Secondary | ICD-10-CM | POA: Diagnosis not present

## 2016-02-22 DIAGNOSIS — L409 Psoriasis, unspecified: Secondary | ICD-10-CM | POA: Diagnosis not present

## 2016-02-22 DIAGNOSIS — R8290 Unspecified abnormal findings in urine: Secondary | ICD-10-CM | POA: Diagnosis not present

## 2016-02-22 DIAGNOSIS — Z125 Encounter for screening for malignant neoplasm of prostate: Secondary | ICD-10-CM | POA: Diagnosis not present

## 2016-02-22 DIAGNOSIS — Z0000 Encounter for general adult medical examination without abnormal findings: Secondary | ICD-10-CM | POA: Diagnosis not present

## 2016-02-29 DIAGNOSIS — R7309 Other abnormal glucose: Secondary | ICD-10-CM | POA: Diagnosis not present

## 2016-02-29 DIAGNOSIS — Z1389 Encounter for screening for other disorder: Secondary | ICD-10-CM | POA: Diagnosis not present

## 2016-02-29 DIAGNOSIS — Z6829 Body mass index (BMI) 29.0-29.9, adult: Secondary | ICD-10-CM | POA: Diagnosis not present

## 2016-02-29 DIAGNOSIS — L409 Psoriasis, unspecified: Secondary | ICD-10-CM | POA: Diagnosis not present

## 2016-02-29 DIAGNOSIS — N400 Enlarged prostate without lower urinary tract symptoms: Secondary | ICD-10-CM | POA: Diagnosis not present

## 2016-02-29 DIAGNOSIS — Z0000 Encounter for general adult medical examination without abnormal findings: Secondary | ICD-10-CM | POA: Diagnosis not present

## 2016-08-30 DIAGNOSIS — Z6831 Body mass index (BMI) 31.0-31.9, adult: Secondary | ICD-10-CM | POA: Diagnosis not present

## 2016-08-30 DIAGNOSIS — R7309 Other abnormal glucose: Secondary | ICD-10-CM | POA: Diagnosis not present

## 2016-08-30 DIAGNOSIS — M179 Osteoarthritis of knee, unspecified: Secondary | ICD-10-CM | POA: Diagnosis not present

## 2016-08-30 DIAGNOSIS — G609 Hereditary and idiopathic neuropathy, unspecified: Secondary | ICD-10-CM | POA: Diagnosis not present

## 2016-08-30 DIAGNOSIS — Z23 Encounter for immunization: Secondary | ICD-10-CM | POA: Diagnosis not present

## 2016-08-30 DIAGNOSIS — M5136 Other intervertebral disc degeneration, lumbar region: Secondary | ICD-10-CM | POA: Diagnosis not present

## 2016-08-31 DIAGNOSIS — H2513 Age-related nuclear cataract, bilateral: Secondary | ICD-10-CM | POA: Diagnosis not present

## 2016-09-11 DIAGNOSIS — G608 Other hereditary and idiopathic neuropathies: Secondary | ICD-10-CM | POA: Diagnosis not present

## 2016-09-11 DIAGNOSIS — M545 Low back pain: Secondary | ICD-10-CM | POA: Diagnosis not present

## 2016-09-11 DIAGNOSIS — M25569 Pain in unspecified knee: Secondary | ICD-10-CM | POA: Diagnosis not present

## 2016-09-14 DIAGNOSIS — G608 Other hereditary and idiopathic neuropathies: Secondary | ICD-10-CM | POA: Diagnosis not present

## 2016-09-14 DIAGNOSIS — M545 Low back pain: Secondary | ICD-10-CM | POA: Diagnosis not present

## 2016-09-14 DIAGNOSIS — M25569 Pain in unspecified knee: Secondary | ICD-10-CM | POA: Diagnosis not present

## 2016-09-19 DIAGNOSIS — M25569 Pain in unspecified knee: Secondary | ICD-10-CM | POA: Diagnosis not present

## 2016-09-19 DIAGNOSIS — G608 Other hereditary and idiopathic neuropathies: Secondary | ICD-10-CM | POA: Diagnosis not present

## 2016-09-19 DIAGNOSIS — M545 Low back pain: Secondary | ICD-10-CM | POA: Diagnosis not present

## 2016-09-21 DIAGNOSIS — M25569 Pain in unspecified knee: Secondary | ICD-10-CM | POA: Diagnosis not present

## 2016-09-21 DIAGNOSIS — M545 Low back pain: Secondary | ICD-10-CM | POA: Diagnosis not present

## 2016-09-21 DIAGNOSIS — G608 Other hereditary and idiopathic neuropathies: Secondary | ICD-10-CM | POA: Diagnosis not present

## 2016-09-26 DIAGNOSIS — G608 Other hereditary and idiopathic neuropathies: Secondary | ICD-10-CM | POA: Diagnosis not present

## 2016-09-26 DIAGNOSIS — M545 Low back pain: Secondary | ICD-10-CM | POA: Diagnosis not present

## 2016-09-26 DIAGNOSIS — M25569 Pain in unspecified knee: Secondary | ICD-10-CM | POA: Diagnosis not present

## 2016-09-28 DIAGNOSIS — G608 Other hereditary and idiopathic neuropathies: Secondary | ICD-10-CM | POA: Diagnosis not present

## 2016-09-28 DIAGNOSIS — M25569 Pain in unspecified knee: Secondary | ICD-10-CM | POA: Diagnosis not present

## 2016-09-28 DIAGNOSIS — M545 Low back pain: Secondary | ICD-10-CM | POA: Diagnosis not present

## 2016-10-04 DIAGNOSIS — M545 Low back pain: Secondary | ICD-10-CM | POA: Diagnosis not present

## 2016-10-04 DIAGNOSIS — G608 Other hereditary and idiopathic neuropathies: Secondary | ICD-10-CM | POA: Diagnosis not present

## 2016-10-04 DIAGNOSIS — M25569 Pain in unspecified knee: Secondary | ICD-10-CM | POA: Diagnosis not present

## 2016-10-10 DIAGNOSIS — G608 Other hereditary and idiopathic neuropathies: Secondary | ICD-10-CM | POA: Diagnosis not present

## 2016-10-10 DIAGNOSIS — M545 Low back pain: Secondary | ICD-10-CM | POA: Diagnosis not present

## 2016-10-10 DIAGNOSIS — M25569 Pain in unspecified knee: Secondary | ICD-10-CM | POA: Diagnosis not present

## 2016-10-12 DIAGNOSIS — M25569 Pain in unspecified knee: Secondary | ICD-10-CM | POA: Diagnosis not present

## 2016-10-12 DIAGNOSIS — G608 Other hereditary and idiopathic neuropathies: Secondary | ICD-10-CM | POA: Diagnosis not present

## 2016-10-12 DIAGNOSIS — M545 Low back pain: Secondary | ICD-10-CM | POA: Diagnosis not present

## 2016-10-18 DIAGNOSIS — M545 Low back pain: Secondary | ICD-10-CM | POA: Diagnosis not present

## 2016-10-18 DIAGNOSIS — M25569 Pain in unspecified knee: Secondary | ICD-10-CM | POA: Diagnosis not present

## 2016-10-18 DIAGNOSIS — G608 Other hereditary and idiopathic neuropathies: Secondary | ICD-10-CM | POA: Diagnosis not present

## 2016-10-24 DIAGNOSIS — M25569 Pain in unspecified knee: Secondary | ICD-10-CM | POA: Diagnosis not present

## 2016-10-24 DIAGNOSIS — G608 Other hereditary and idiopathic neuropathies: Secondary | ICD-10-CM | POA: Diagnosis not present

## 2016-10-24 DIAGNOSIS — M545 Low back pain: Secondary | ICD-10-CM | POA: Diagnosis not present

## 2016-11-06 DIAGNOSIS — M545 Low back pain: Secondary | ICD-10-CM | POA: Diagnosis not present

## 2016-11-06 DIAGNOSIS — G608 Other hereditary and idiopathic neuropathies: Secondary | ICD-10-CM | POA: Diagnosis not present

## 2016-11-06 DIAGNOSIS — M25569 Pain in unspecified knee: Secondary | ICD-10-CM | POA: Diagnosis not present

## 2016-11-08 DIAGNOSIS — M545 Low back pain: Secondary | ICD-10-CM | POA: Diagnosis not present

## 2016-11-08 DIAGNOSIS — G608 Other hereditary and idiopathic neuropathies: Secondary | ICD-10-CM | POA: Diagnosis not present

## 2016-11-08 DIAGNOSIS — M25569 Pain in unspecified knee: Secondary | ICD-10-CM | POA: Diagnosis not present

## 2016-11-20 DIAGNOSIS — G608 Other hereditary and idiopathic neuropathies: Secondary | ICD-10-CM | POA: Diagnosis not present

## 2016-11-20 DIAGNOSIS — M545 Low back pain: Secondary | ICD-10-CM | POA: Diagnosis not present

## 2016-11-20 DIAGNOSIS — M25569 Pain in unspecified knee: Secondary | ICD-10-CM | POA: Diagnosis not present

## 2016-11-27 DIAGNOSIS — M545 Low back pain: Secondary | ICD-10-CM | POA: Diagnosis not present

## 2016-11-27 DIAGNOSIS — M25569 Pain in unspecified knee: Secondary | ICD-10-CM | POA: Diagnosis not present

## 2016-11-27 DIAGNOSIS — G608 Other hereditary and idiopathic neuropathies: Secondary | ICD-10-CM | POA: Diagnosis not present

## 2016-11-29 DIAGNOSIS — G608 Other hereditary and idiopathic neuropathies: Secondary | ICD-10-CM | POA: Diagnosis not present

## 2016-11-29 DIAGNOSIS — M545 Low back pain: Secondary | ICD-10-CM | POA: Diagnosis not present

## 2016-11-29 DIAGNOSIS — M25569 Pain in unspecified knee: Secondary | ICD-10-CM | POA: Diagnosis not present

## 2016-12-04 DIAGNOSIS — M545 Low back pain: Secondary | ICD-10-CM | POA: Diagnosis not present

## 2016-12-04 DIAGNOSIS — G608 Other hereditary and idiopathic neuropathies: Secondary | ICD-10-CM | POA: Diagnosis not present

## 2016-12-04 DIAGNOSIS — M25569 Pain in unspecified knee: Secondary | ICD-10-CM | POA: Diagnosis not present

## 2016-12-06 DIAGNOSIS — M545 Low back pain: Secondary | ICD-10-CM | POA: Diagnosis not present

## 2016-12-06 DIAGNOSIS — G608 Other hereditary and idiopathic neuropathies: Secondary | ICD-10-CM | POA: Diagnosis not present

## 2016-12-06 DIAGNOSIS — M25569 Pain in unspecified knee: Secondary | ICD-10-CM | POA: Diagnosis not present

## 2016-12-11 DIAGNOSIS — M545 Low back pain: Secondary | ICD-10-CM | POA: Diagnosis not present

## 2016-12-11 DIAGNOSIS — M25569 Pain in unspecified knee: Secondary | ICD-10-CM | POA: Diagnosis not present

## 2016-12-11 DIAGNOSIS — G608 Other hereditary and idiopathic neuropathies: Secondary | ICD-10-CM | POA: Diagnosis not present

## 2017-01-16 DIAGNOSIS — S61210A Laceration without foreign body of right index finger without damage to nail, initial encounter: Secondary | ICD-10-CM | POA: Diagnosis not present

## 2017-01-16 DIAGNOSIS — Z23 Encounter for immunization: Secondary | ICD-10-CM | POA: Diagnosis not present

## 2017-02-07 DIAGNOSIS — N481 Balanitis: Secondary | ICD-10-CM | POA: Diagnosis not present

## 2017-02-07 DIAGNOSIS — R351 Nocturia: Secondary | ICD-10-CM | POA: Diagnosis not present

## 2017-02-07 DIAGNOSIS — N5201 Erectile dysfunction due to arterial insufficiency: Secondary | ICD-10-CM | POA: Diagnosis not present

## 2017-02-07 DIAGNOSIS — N401 Enlarged prostate with lower urinary tract symptoms: Secondary | ICD-10-CM | POA: Diagnosis not present

## 2017-03-15 DIAGNOSIS — Z125 Encounter for screening for malignant neoplasm of prostate: Secondary | ICD-10-CM | POA: Diagnosis not present

## 2017-03-15 DIAGNOSIS — R7309 Other abnormal glucose: Secondary | ICD-10-CM | POA: Diagnosis not present

## 2017-03-15 DIAGNOSIS — D7589 Other specified diseases of blood and blood-forming organs: Secondary | ICD-10-CM | POA: Diagnosis not present

## 2017-03-15 DIAGNOSIS — R972 Elevated prostate specific antigen [PSA]: Secondary | ICD-10-CM | POA: Diagnosis not present

## 2017-03-20 DIAGNOSIS — Z6831 Body mass index (BMI) 31.0-31.9, adult: Secondary | ICD-10-CM | POA: Diagnosis not present

## 2017-03-20 DIAGNOSIS — R7309 Other abnormal glucose: Secondary | ICD-10-CM | POA: Diagnosis not present

## 2017-03-20 DIAGNOSIS — R972 Elevated prostate specific antigen [PSA]: Secondary | ICD-10-CM | POA: Diagnosis not present

## 2017-03-20 DIAGNOSIS — G608 Other hereditary and idiopathic neuropathies: Secondary | ICD-10-CM | POA: Diagnosis not present

## 2017-03-20 DIAGNOSIS — L408 Other psoriasis: Secondary | ICD-10-CM | POA: Diagnosis not present

## 2017-03-20 DIAGNOSIS — Z1389 Encounter for screening for other disorder: Secondary | ICD-10-CM | POA: Diagnosis not present

## 2017-03-20 DIAGNOSIS — M179 Osteoarthritis of knee, unspecified: Secondary | ICD-10-CM | POA: Diagnosis not present

## 2017-03-20 DIAGNOSIS — Z0000 Encounter for general adult medical examination without abnormal findings: Secondary | ICD-10-CM | POA: Diagnosis not present

## 2017-03-20 DIAGNOSIS — M25552 Pain in left hip: Secondary | ICD-10-CM | POA: Diagnosis not present

## 2017-03-21 ENCOUNTER — Other Ambulatory Visit: Payer: Self-pay | Admitting: Internal Medicine

## 2017-03-21 DIAGNOSIS — Z87891 Personal history of nicotine dependence: Secondary | ICD-10-CM

## 2017-03-22 DIAGNOSIS — Z1212 Encounter for screening for malignant neoplasm of rectum: Secondary | ICD-10-CM | POA: Diagnosis not present

## 2017-04-10 ENCOUNTER — Ambulatory Visit
Admission: RE | Admit: 2017-04-10 | Discharge: 2017-04-10 | Disposition: A | Discharge status: Home / Self Care | Payer: Medicare Other | Source: Ambulatory Visit | Attending: Internal Medicine | Admitting: Internal Medicine

## 2017-04-10 DIAGNOSIS — Z87891 Personal history of nicotine dependence: Secondary | ICD-10-CM | POA: Diagnosis not present

## 2017-04-10 DIAGNOSIS — Z136 Encounter for screening for cardiovascular disorders: Secondary | ICD-10-CM | POA: Diagnosis not present

## 2017-04-11 DIAGNOSIS — R972 Elevated prostate specific antigen [PSA]: Secondary | ICD-10-CM | POA: Diagnosis not present

## 2017-04-11 DIAGNOSIS — R3912 Poor urinary stream: Secondary | ICD-10-CM | POA: Diagnosis not present

## 2017-04-11 DIAGNOSIS — N481 Balanitis: Secondary | ICD-10-CM | POA: Diagnosis not present

## 2017-04-11 DIAGNOSIS — N401 Enlarged prostate with lower urinary tract symptoms: Secondary | ICD-10-CM | POA: Diagnosis not present

## 2017-04-26 DIAGNOSIS — Z87891 Personal history of nicotine dependence: Secondary | ICD-10-CM | POA: Diagnosis not present

## 2017-04-26 DIAGNOSIS — I77811 Abdominal aortic ectasia: Secondary | ICD-10-CM | POA: Diagnosis not present

## 2017-04-26 DIAGNOSIS — R739 Hyperglycemia, unspecified: Secondary | ICD-10-CM | POA: Diagnosis not present

## 2017-04-26 DIAGNOSIS — I2510 Atherosclerotic heart disease of native coronary artery without angina pectoris: Secondary | ICD-10-CM | POA: Diagnosis not present

## 2017-05-09 DIAGNOSIS — Z77098 Contact with and (suspected) exposure to other hazardous, chiefly nonmedicinal, chemicals: Secondary | ICD-10-CM | POA: Diagnosis not present

## 2017-05-09 DIAGNOSIS — I2510 Atherosclerotic heart disease of native coronary artery without angina pectoris: Secondary | ICD-10-CM | POA: Diagnosis not present

## 2017-05-14 DIAGNOSIS — Z77098 Contact with and (suspected) exposure to other hazardous, chiefly nonmedicinal, chemicals: Secondary | ICD-10-CM | POA: Diagnosis not present

## 2017-05-14 DIAGNOSIS — I2510 Atherosclerotic heart disease of native coronary artery without angina pectoris: Secondary | ICD-10-CM | POA: Diagnosis not present

## 2017-05-24 DIAGNOSIS — M25561 Pain in right knee: Secondary | ICD-10-CM | POA: Diagnosis not present

## 2017-05-24 DIAGNOSIS — M1712 Unilateral primary osteoarthritis, left knee: Secondary | ICD-10-CM | POA: Diagnosis not present

## 2017-05-24 DIAGNOSIS — M25562 Pain in left knee: Secondary | ICD-10-CM | POA: Diagnosis not present

## 2017-05-24 DIAGNOSIS — M1711 Unilateral primary osteoarthritis, right knee: Secondary | ICD-10-CM | POA: Diagnosis not present

## 2017-05-25 DIAGNOSIS — I2510 Atherosclerotic heart disease of native coronary artery without angina pectoris: Secondary | ICD-10-CM | POA: Diagnosis not present

## 2017-05-25 DIAGNOSIS — E782 Mixed hyperlipidemia: Secondary | ICD-10-CM | POA: Diagnosis not present

## 2017-06-05 DIAGNOSIS — M1712 Unilateral primary osteoarthritis, left knee: Secondary | ICD-10-CM | POA: Diagnosis not present

## 2017-06-05 DIAGNOSIS — M25561 Pain in right knee: Secondary | ICD-10-CM | POA: Diagnosis not present

## 2017-06-05 DIAGNOSIS — M25562 Pain in left knee: Secondary | ICD-10-CM | POA: Diagnosis not present

## 2017-06-05 DIAGNOSIS — M1711 Unilateral primary osteoarthritis, right knee: Secondary | ICD-10-CM | POA: Diagnosis not present

## 2017-06-07 DIAGNOSIS — R739 Hyperglycemia, unspecified: Secondary | ICD-10-CM | POA: Diagnosis not present

## 2017-06-07 DIAGNOSIS — I2510 Atherosclerotic heart disease of native coronary artery without angina pectoris: Secondary | ICD-10-CM | POA: Diagnosis not present

## 2017-06-07 DIAGNOSIS — M25562 Pain in left knee: Secondary | ICD-10-CM | POA: Diagnosis not present

## 2017-06-07 DIAGNOSIS — Z87891 Personal history of nicotine dependence: Secondary | ICD-10-CM | POA: Diagnosis not present

## 2017-06-07 DIAGNOSIS — M25561 Pain in right knee: Secondary | ICD-10-CM | POA: Diagnosis not present

## 2017-06-07 DIAGNOSIS — M1712 Unilateral primary osteoarthritis, left knee: Secondary | ICD-10-CM | POA: Diagnosis not present

## 2017-06-07 DIAGNOSIS — M1711 Unilateral primary osteoarthritis, right knee: Secondary | ICD-10-CM | POA: Diagnosis not present

## 2017-06-07 DIAGNOSIS — I77811 Abdominal aortic ectasia: Secondary | ICD-10-CM | POA: Diagnosis not present

## 2017-06-12 DIAGNOSIS — M1711 Unilateral primary osteoarthritis, right knee: Secondary | ICD-10-CM | POA: Diagnosis not present

## 2017-06-12 DIAGNOSIS — M25561 Pain in right knee: Secondary | ICD-10-CM | POA: Diagnosis not present

## 2017-06-12 DIAGNOSIS — M1712 Unilateral primary osteoarthritis, left knee: Secondary | ICD-10-CM | POA: Diagnosis not present

## 2017-06-12 DIAGNOSIS — M25562 Pain in left knee: Secondary | ICD-10-CM | POA: Diagnosis not present

## 2017-06-14 DIAGNOSIS — M25561 Pain in right knee: Secondary | ICD-10-CM | POA: Diagnosis not present

## 2017-06-14 DIAGNOSIS — M25562 Pain in left knee: Secondary | ICD-10-CM | POA: Diagnosis not present

## 2017-06-14 DIAGNOSIS — M1711 Unilateral primary osteoarthritis, right knee: Secondary | ICD-10-CM | POA: Diagnosis not present

## 2017-06-14 DIAGNOSIS — M1712 Unilateral primary osteoarthritis, left knee: Secondary | ICD-10-CM | POA: Diagnosis not present

## 2017-06-19 DIAGNOSIS — M25562 Pain in left knee: Secondary | ICD-10-CM | POA: Diagnosis not present

## 2017-06-19 DIAGNOSIS — M25561 Pain in right knee: Secondary | ICD-10-CM | POA: Diagnosis not present

## 2017-06-19 DIAGNOSIS — M1711 Unilateral primary osteoarthritis, right knee: Secondary | ICD-10-CM | POA: Diagnosis not present

## 2017-06-19 DIAGNOSIS — M1712 Unilateral primary osteoarthritis, left knee: Secondary | ICD-10-CM | POA: Diagnosis not present

## 2017-06-21 DIAGNOSIS — M25561 Pain in right knee: Secondary | ICD-10-CM | POA: Diagnosis not present

## 2017-06-21 DIAGNOSIS — M1712 Unilateral primary osteoarthritis, left knee: Secondary | ICD-10-CM | POA: Diagnosis not present

## 2017-06-21 DIAGNOSIS — M25562 Pain in left knee: Secondary | ICD-10-CM | POA: Diagnosis not present

## 2017-06-21 DIAGNOSIS — M1711 Unilateral primary osteoarthritis, right knee: Secondary | ICD-10-CM | POA: Diagnosis not present

## 2017-09-05 DIAGNOSIS — H2513 Age-related nuclear cataract, bilateral: Secondary | ICD-10-CM | POA: Diagnosis not present

## 2017-09-26 DIAGNOSIS — I77811 Abdominal aortic ectasia: Secondary | ICD-10-CM | POA: Diagnosis not present

## 2017-09-26 DIAGNOSIS — E782 Mixed hyperlipidemia: Secondary | ICD-10-CM | POA: Diagnosis not present

## 2017-09-26 DIAGNOSIS — Z6831 Body mass index (BMI) 31.0-31.9, adult: Secondary | ICD-10-CM | POA: Diagnosis not present

## 2017-09-26 DIAGNOSIS — I2510 Atherosclerotic heart disease of native coronary artery without angina pectoris: Secondary | ICD-10-CM | POA: Diagnosis not present

## 2017-09-26 DIAGNOSIS — M179 Osteoarthritis of knee, unspecified: Secondary | ICD-10-CM | POA: Diagnosis not present

## 2017-09-26 DIAGNOSIS — W57XXXS Bitten or stung by nonvenomous insect and other nonvenomous arthropods, sequela: Secondary | ICD-10-CM | POA: Diagnosis not present

## 2017-09-26 DIAGNOSIS — L408 Other psoriasis: Secondary | ICD-10-CM | POA: Diagnosis not present

## 2017-09-26 DIAGNOSIS — R7309 Other abnormal glucose: Secondary | ICD-10-CM | POA: Diagnosis not present

## 2017-09-26 DIAGNOSIS — R972 Elevated prostate specific antigen [PSA]: Secondary | ICD-10-CM | POA: Diagnosis not present

## 2017-09-26 DIAGNOSIS — N400 Enlarged prostate without lower urinary tract symptoms: Secondary | ICD-10-CM | POA: Diagnosis not present

## 2017-09-26 DIAGNOSIS — Z23 Encounter for immunization: Secondary | ICD-10-CM | POA: Diagnosis not present

## 2017-10-10 DIAGNOSIS — N5201 Erectile dysfunction due to arterial insufficiency: Secondary | ICD-10-CM | POA: Diagnosis not present

## 2017-10-10 DIAGNOSIS — N401 Enlarged prostate with lower urinary tract symptoms: Secondary | ICD-10-CM | POA: Diagnosis not present

## 2017-10-10 DIAGNOSIS — R972 Elevated prostate specific antigen [PSA]: Secondary | ICD-10-CM | POA: Diagnosis not present

## 2017-10-10 DIAGNOSIS — R3912 Poor urinary stream: Secondary | ICD-10-CM | POA: Diagnosis not present

## 2017-12-12 DIAGNOSIS — R1084 Generalized abdominal pain: Secondary | ICD-10-CM | POA: Diagnosis not present

## 2017-12-12 DIAGNOSIS — R7309 Other abnormal glucose: Secondary | ICD-10-CM | POA: Diagnosis not present

## 2017-12-12 DIAGNOSIS — Z6830 Body mass index (BMI) 30.0-30.9, adult: Secondary | ICD-10-CM | POA: Diagnosis not present

## 2017-12-12 DIAGNOSIS — R197 Diarrhea, unspecified: Secondary | ICD-10-CM | POA: Diagnosis not present

## 2017-12-12 DIAGNOSIS — R198 Other specified symptoms and signs involving the digestive system and abdomen: Secondary | ICD-10-CM | POA: Diagnosis not present

## 2018-03-14 DIAGNOSIS — Z79899 Other long term (current) drug therapy: Secondary | ICD-10-CM | POA: Diagnosis not present

## 2018-03-14 DIAGNOSIS — R7309 Other abnormal glucose: Secondary | ICD-10-CM | POA: Diagnosis not present

## 2018-03-14 DIAGNOSIS — Z125 Encounter for screening for malignant neoplasm of prostate: Secondary | ICD-10-CM | POA: Diagnosis not present

## 2018-03-14 DIAGNOSIS — R82998 Other abnormal findings in urine: Secondary | ICD-10-CM | POA: Diagnosis not present

## 2018-03-18 DIAGNOSIS — R972 Elevated prostate specific antigen [PSA]: Secondary | ICD-10-CM | POA: Diagnosis not present

## 2018-03-20 DIAGNOSIS — Z1212 Encounter for screening for malignant neoplasm of rectum: Secondary | ICD-10-CM | POA: Diagnosis not present

## 2018-03-21 DIAGNOSIS — D7589 Other specified diseases of blood and blood-forming organs: Secondary | ICD-10-CM | POA: Diagnosis not present

## 2018-03-21 DIAGNOSIS — Z87891 Personal history of nicotine dependence: Secondary | ICD-10-CM | POA: Diagnosis not present

## 2018-03-21 DIAGNOSIS — Z1389 Encounter for screening for other disorder: Secondary | ICD-10-CM | POA: Diagnosis not present

## 2018-03-21 DIAGNOSIS — I10 Essential (primary) hypertension: Secondary | ICD-10-CM | POA: Diagnosis not present

## 2018-03-21 DIAGNOSIS — Z6830 Body mass index (BMI) 30.0-30.9, adult: Secondary | ICD-10-CM | POA: Diagnosis not present

## 2018-03-21 DIAGNOSIS — D892 Hypergammaglobulinemia, unspecified: Secondary | ICD-10-CM | POA: Diagnosis not present

## 2018-03-21 DIAGNOSIS — G608 Other hereditary and idiopathic neuropathies: Secondary | ICD-10-CM | POA: Diagnosis not present

## 2018-03-21 DIAGNOSIS — I2510 Atherosclerotic heart disease of native coronary artery without angina pectoris: Secondary | ICD-10-CM | POA: Diagnosis not present

## 2018-03-21 DIAGNOSIS — Z0000 Encounter for general adult medical examination without abnormal findings: Secondary | ICD-10-CM | POA: Diagnosis not present

## 2018-03-21 DIAGNOSIS — R7309 Other abnormal glucose: Secondary | ICD-10-CM | POA: Diagnosis not present

## 2018-03-21 DIAGNOSIS — I714 Abdominal aortic aneurysm, without rupture: Secondary | ICD-10-CM | POA: Diagnosis not present

## 2018-03-21 DIAGNOSIS — N400 Enlarged prostate without lower urinary tract symptoms: Secondary | ICD-10-CM | POA: Diagnosis not present

## 2018-03-22 ENCOUNTER — Other Ambulatory Visit: Payer: Self-pay | Admitting: Internal Medicine

## 2018-03-22 DIAGNOSIS — Z0000 Encounter for general adult medical examination without abnormal findings: Secondary | ICD-10-CM

## 2018-03-22 DIAGNOSIS — Z87891 Personal history of nicotine dependence: Secondary | ICD-10-CM

## 2018-03-27 DIAGNOSIS — N401 Enlarged prostate with lower urinary tract symptoms: Secondary | ICD-10-CM | POA: Diagnosis not present

## 2018-03-27 DIAGNOSIS — N5201 Erectile dysfunction due to arterial insufficiency: Secondary | ICD-10-CM | POA: Diagnosis not present

## 2018-03-27 DIAGNOSIS — R3912 Poor urinary stream: Secondary | ICD-10-CM | POA: Diagnosis not present

## 2018-04-03 ENCOUNTER — Ambulatory Visit
Admission: RE | Admit: 2018-04-03 | Discharge: 2018-04-03 | Disposition: A | Discharge status: Home / Self Care | Payer: Medicare Other | Source: Ambulatory Visit | Attending: Internal Medicine | Admitting: Internal Medicine

## 2018-04-03 DIAGNOSIS — F17210 Nicotine dependence, cigarettes, uncomplicated: Secondary | ICD-10-CM | POA: Diagnosis not present

## 2018-04-03 DIAGNOSIS — Z87891 Personal history of nicotine dependence: Secondary | ICD-10-CM

## 2018-04-03 DIAGNOSIS — Z0000 Encounter for general adult medical examination without abnormal findings: Secondary | ICD-10-CM

## 2018-04-16 ENCOUNTER — Encounter: Payer: Medicare Other | Admitting: Thoracic Surgery (Cardiothoracic Vascular Surgery)

## 2018-04-23 ENCOUNTER — Other Ambulatory Visit: Payer: Self-pay | Admitting: *Deleted

## 2018-04-23 ENCOUNTER — Encounter: Payer: Self-pay | Admitting: Thoracic Surgery (Cardiothoracic Vascular Surgery)

## 2018-04-23 ENCOUNTER — Institutional Professional Consult (permissible substitution) (INDEPENDENT_AMBULATORY_CARE_PROVIDER_SITE_OTHER): Payer: Medicare Other | Admitting: Thoracic Surgery (Cardiothoracic Vascular Surgery)

## 2018-04-23 VITALS — BP 123/78 | HR 70 | Resp 20 | Ht 73.75 in | Wt 226.0 lb

## 2018-04-23 DIAGNOSIS — R911 Solitary pulmonary nodule: Secondary | ICD-10-CM | POA: Diagnosis not present

## 2018-04-23 DIAGNOSIS — J432 Centrilobular emphysema: Secondary | ICD-10-CM | POA: Diagnosis not present

## 2018-04-23 DIAGNOSIS — J439 Emphysema, unspecified: Secondary | ICD-10-CM | POA: Insufficient documentation

## 2018-04-23 NOTE — Progress Notes (Signed)
PCP is Jonathon Jordan, MD Referring Provider is Velna Hatchet, MD  Chief Complaint  Patient presents with  . Lung Lesion    Surgical eval, Chest CT 04/03/18    HPI: Joseph Decker is sent for consultation regarding a right upper lobe nodule  Joseph Decker is a 72 year old gentleman with a past history significant for tobacco abuse (2 packs/day for 40 years), anxiety, PTSD, psoriasis, arthritis with bilateral knee replacements, peripheral neuropathy, lumbar disc disease, and CT evidence of coronary atherosclerosis. He recently saw Dr. Ardeth Perfect for his annual physical.  He had started the low-dose CT screening program for lung cancer last year.  He did not have any suspicious lesions on his scan.  He had another scan this year, which showed a new 13 mm nodule in the right upper lobe.  He smoked 2 packs/day prior to quitting in 2009.  He has an 80-pack-year history overall.  He has been feeling well.  He has knee pain bilaterally.  He is not having any chest pain, pressure, tightness, or shortness of breath at rest or with exertion.  He recently put up a new fence around his whole yard.  He rides his bike several miles a day.  He has not had any change in appetite or weight loss.  He denies any headaches or visual changes.  Overall he feels well.  Zubrod Score: At the time of surgery this patient's most appropriate activity status/level should be described as: [x]     0    Normal activity, no symptoms []     1    Restricted in physical strenuous activity but ambulatory, able to do out light work []     2    Ambulatory and capable of self care, unable to do work activities, up and about >50 % of waking hours                              []     3    Only limited self care, in bed greater than 50% of waking hours []     4    Completely disabled, no self care, confined to bed or chair []     5    Moribund  Past Medical History:  Diagnosis Date  . Arthritis   . CAD (coronary artery disease)   . DDD  (degenerative disc disease), lumbar   . Enlarged prostate   . GERD (gastroesophageal reflux disease)   . Hyperglycemia   . Macrocytosis without anemia   . Peripheral neuropathy   . Psoriasis   . PTSD (post-traumatic stress disorder)     Past Surgical History:  Procedure Laterality Date  . BACK SURGERY    . REPLACEMENT TOTAL KNEE BILATERAL      Family History  Problem Relation Age of Onset  . Alzheimer's disease Mother   . Lead poisoning Father     Social History Social History   Tobacco Use  . Smoking status: Former Smoker    Packs/day: 1.00    Years: 40.00    Pack years: 40.00  . Smokeless tobacco: Never Used  Substance Use Topics  . Alcohol use: No  . Drug use: No    Current Outpatient Medications  Medication Sig Dispense Refill  . rosuvastatin (CRESTOR) 10 MG tablet Take 10 mg by mouth daily.    . tamsulosin (FLOMAX) 0.4 MG CAPS capsule Take 0.4 mg by mouth daily.    Marland Kitchen telmisartan (MICARDIS) 20 MG tablet  TK 1 T PO QD  3   No current facility-administered medications for this visit.     Allergies  Allergen Reactions  . Antihistamines, Chlorpheniramine-Type Other (See Comments)    Feels drunk  . Oxycodone Other (See Comments)    Gout symptoms, swollen and painful joints    Review of Systems  Constitutional: Negative for activity change, appetite change, chills, fever and unexpected weight change.  HENT: Positive for hearing loss. Negative for trouble swallowing and voice change.   Eyes: Negative for visual disturbance.  Respiratory: Negative for chest tightness and shortness of breath.   Cardiovascular: Negative for chest pain, palpitations and leg swelling.  Gastrointestinal: Negative for abdominal distention and abdominal pain.  Endocrine: Negative for polydipsia and polyphagia.  Genitourinary: Positive for frequency.       On tamsulosin for prostate enlargement  Musculoskeletal: Positive for arthralgias and myalgias.       Exertion related   Neurological: Negative for seizures, syncope and headaches.  Hematological: Negative for adenopathy. Does not bruise/bleed easily.  Psychiatric/Behavioral: The patient is nervous/anxious (PTSD).   All other systems reviewed and are negative.   BP 123/78   Pulse 70   Resp 20   Ht 6' 1.75" (1.873 m)   Wt 226 lb (102.5 kg)   SpO2 98% Comment: RA  BMI 29.21 kg/m  Physical Exam  Constitutional: He is oriented to person, place, and time. He appears well-developed and well-nourished. No distress.  HENT:  Head: Normocephalic and atraumatic.  Mouth/Throat: Oropharynx is clear and moist. No oropharyngeal exudate.  Eyes: Pupils are equal, round, and reactive to light. Conjunctivae and EOM are normal. No scleral icterus.  Neck: Neck supple. No thyromegaly present.  Cardiovascular: Normal rate, regular rhythm, normal heart sounds and intact distal pulses. Exam reveals no gallop and no friction rub.  No murmur heard. Pulmonary/Chest: Effort normal and breath sounds normal. No stridor. No respiratory distress. He has no wheezes.  Abdominal: Soft. He exhibits no distension. There is no tenderness.  Musculoskeletal: He exhibits no edema.  Lymphadenopathy:    He has no cervical adenopathy.  Neurological: He is alert and oriented to person, place, and time. No cranial nerve deficit. He exhibits normal muscle tone.  Skin: Skin is warm and dry.  Vitals reviewed.    Diagnostic Tests: CT CHEST WITHOUT CONTRAST LOW-DOSE FOR LUNG CANCER SCREENING  TECHNIQUE: Multidetector CT imaging of the chest was performed following the standard protocol without IV contrast.  COMPARISON:  04/10/2017  FINDINGS: Cardiovascular: The heart size appears normal. No pericardial effusion. Aortic atherosclerosis. Calcification within the RCA, LAD coronary arteries noted.  Mediastinum/Nodes: Normal appearance of the thyroid gland. The trachea appears patent and is midline. Normal appearance of the esophagus.  No enlarged mediastinal or hilar lymph nodes.  Lungs/Pleura: Moderate to advanced changes of centrilobular and paraseptal emphysema. There is a new spiculated nodule within the periphery of the right upper lobe. This has an equivalent diameter of 13.1 mm. Calcified granuloma in the left apex is again noted.  Upper Abdomen: No acute abnormality.  Musculoskeletal: Mild spondylosis within the thoracic spine.  IMPRESSION: 1. Lung-RADS 4B, suspicious. Additional imaging evaluation or consultation with Pulmonology or Thoracic Surgery recommended. 2. Aortic Atherosclerosis (ICD10-I70.0) and Emphysema (ICD10-J43.9). 3. RCA and LAD coronary artery calcifications.   Electronically Signed   By: Kerby Moors M.D.   On: 04/03/2018 15:48  I personally reviewed the CT images and concur with the findings noted above.  I also reviewed the images with Joseph Decker  Impression: Joseph Decker is a 72 year old man with a history of tobacco abuse, anxiety, PTSD, coronary calcification on CT, degenerative joint disease, degenerative disc disease, psoriasis and BPH.  He started the lung cancer screening program a year ago.  His CT in 2018 showed no suspicious lesions.  He recently had his annual low-dose CT and it showed a 13 mm nodule in the right upper lobe with surrounding bullae.  Given that this is a new lesion, his age, and his history of tobacco abuse this is highly suspicious for a new primary bronchogenic carcinoma.  By CT it would be a T1, N0, stage IA lesion.    Granulomatous disease is also in the differential.  He has not been having any fevers, chills, sweats, cough or congestion.  That does not rule out the possibility of granulomatous disease, but carcinoma is more likely.  The next step in his work-up is a PET/CT to further characterize the nodule, look for any signs of metastasis, and guide initial diagnostic work-up.  His exercise tolerance is excellent, but we do need to get pulmonary  function testing.  Emphysema-has centrilobular emphysema noted on CT.  No wheezing.  Right upper lobe is most heavily affected portion of the lung.  Coronary calcification on CT-has radiographic evidence of coronary artery disease.  No symptoms of angina.  Tobacco abuse-quit 10 years ago.  Plan: PET/CT-new lung nodule and patient with history of tobacco abuse, CAD initial diagnostic work-up Pulmonary function testing rhythm without bronchodilators Return in 1 to 2 weeks after scan and PFT done.  Melrose Nakayama, MD [facility] [phone]

## 2018-05-01 ENCOUNTER — Ambulatory Visit (HOSPITAL_COMMUNITY)
Admission: RE | Admit: 2018-05-01 | Discharge: 2018-05-01 | Disposition: A | Discharge status: Home / Self Care | Payer: Medicare Other | Source: Ambulatory Visit | Attending: Thoracic Surgery (Cardiothoracic Vascular Surgery) | Admitting: Thoracic Surgery (Cardiothoracic Vascular Surgery)

## 2018-05-01 DIAGNOSIS — R911 Solitary pulmonary nodule: Secondary | ICD-10-CM | POA: Diagnosis not present

## 2018-05-01 DIAGNOSIS — I2510 Atherosclerotic heart disease of native coronary artery without angina pectoris: Secondary | ICD-10-CM | POA: Diagnosis not present

## 2018-05-01 DIAGNOSIS — I700 Atherosclerosis of aorta: Secondary | ICD-10-CM | POA: Insufficient documentation

## 2018-05-01 DIAGNOSIS — J439 Emphysema, unspecified: Secondary | ICD-10-CM | POA: Insufficient documentation

## 2018-05-01 LAB — GLUCOSE, CAPILLARY: Glucose-Capillary: 135 mg/dL — ABNORMAL HIGH (ref 65–99)

## 2018-05-01 MED ORDER — FLUDEOXYGLUCOSE F - 18 (FDG) INJECTION
11.2100 | Freq: Once | INTRAVENOUS | Status: AC | PRN
  Administered 2018-05-01: 11.21 via INTRAVENOUS

## 2018-05-02 ENCOUNTER — Ambulatory Visit (HOSPITAL_COMMUNITY)
Admission: RE | Admit: 2018-05-02 | Discharge: 2018-05-02 | Disposition: A | Discharge status: Home / Self Care | Payer: Medicare Other | Source: Ambulatory Visit | Attending: Thoracic Surgery (Cardiothoracic Vascular Surgery) | Admitting: Thoracic Surgery (Cardiothoracic Vascular Surgery)

## 2018-05-02 DIAGNOSIS — R911 Solitary pulmonary nodule: Secondary | ICD-10-CM | POA: Diagnosis not present

## 2018-05-02 LAB — PULMONARY FUNCTION TEST
DL/VA % pred: 66 %
DL/VA: 3.19 ml/min/mmHg/L
DLCO UNC: 19.67 ml/min/mmHg
DLCO unc % pred: 52 %
FEF 25-75 POST: 1.81 L/s
FEF 25-75 Pre: 2.07 L/sec
FEF2575-%Change-Post: -12 %
FEF2575-%PRED-PRE: 74 %
FEF2575-%Pred-Post: 64 %
FEV1-%Change-Post: -2 %
FEV1-%PRED-POST: 88 %
FEV1-%PRED-PRE: 90 %
FEV1-PRE: 3.39 L
FEV1-Post: 3.28 L
FEV1FVC-%Change-Post: 3 %
FEV1FVC-%PRED-PRE: 94 %
FEV6-%Change-Post: -4 %
FEV6-%PRED-POST: 93 %
FEV6-%PRED-PRE: 97 %
FEV6-POST: 4.48 L
FEV6-Pre: 4.68 L
FEV6FVC-%CHANGE-POST: 2 %
FEV6FVC-%PRED-POST: 102 %
FEV6FVC-%Pred-Pre: 100 %
FVC-%Change-Post: -6 %
FVC-%PRED-PRE: 96 %
FVC-%Pred-Post: 90 %
FVC-POST: 4.59 L
FVC-PRE: 4.89 L
POST FEV6/FVC RATIO: 98 %
PRE FEV1/FVC RATIO: 69 %
Post FEV1/FVC ratio: 72 %
Pre FEV6/FVC Ratio: 96 %
RV % pred: 79 %
RV: 2.13 L
TLC % PRED: 80 %
TLC: 6.3 L

## 2018-05-02 MED ORDER — ALBUTEROL SULFATE (2.5 MG/3ML) 0.083% IN NEBU
2.5000 mg | INHALATION_SOLUTION | Freq: Once | RESPIRATORY_TRACT | Status: AC
Start: 2018-05-02 — End: 2018-05-02
  Administered 2018-05-02: 2.5 mg via RESPIRATORY_TRACT

## 2018-05-03 ENCOUNTER — Encounter (HOSPITAL_COMMUNITY): Payer: Medicare Other

## 2018-05-07 ENCOUNTER — Encounter: Payer: Self-pay | Admitting: Thoracic Surgery (Cardiothoracic Vascular Surgery)

## 2018-05-07 ENCOUNTER — Other Ambulatory Visit: Payer: Self-pay | Admitting: *Deleted

## 2018-05-07 ENCOUNTER — Other Ambulatory Visit: Payer: Self-pay

## 2018-05-07 ENCOUNTER — Ambulatory Visit (INDEPENDENT_AMBULATORY_CARE_PROVIDER_SITE_OTHER): Payer: Medicare Other | Admitting: Thoracic Surgery (Cardiothoracic Vascular Surgery)

## 2018-05-07 VITALS — BP 117/80 | HR 66 | Resp 16 | Ht 73.76 in | Wt 226.0 lb

## 2018-05-07 DIAGNOSIS — R911 Solitary pulmonary nodule: Secondary | ICD-10-CM

## 2018-05-07 NOTE — Progress Notes (Signed)
[address]       [place] [address]             [phone]       HPI: Mr. Camacho returns to discuss the results of his PET CT and pulmonary function testing  Mr. Kiper is a 72 year old man with a history of tobacco abuse entheses 80 pack years), PTSD, anxiety, psoriasis, degenerative joint disease with previous bilateral knee replacements, lumbar disc disease, and peripheral neuropathy.  He started the low-dose screening CT program for lung cancer last year.  His initial scan did not show any suspicious nodules.  His scan this year showed a new 13 mm nodule in the right upper lobe.  This was a spiculated nodule peripherally in an area with significant emphysema.  He has problems with bilateral knee pain as well as lumbar spine pain and peripheral neuropathy.  Despite that he is able to exercise on a regular basis.  He rides his bike several miles a day.  He has not had any changes in appetite or weight loss.  He is very anxious about the results of his studies.  Past Medical History:  Diagnosis Date  . Arthritis   . CAD (coronary artery disease)   . DDD (degenerative disc disease), lumbar   . Enlarged prostate   . GERD (gastroesophageal reflux disease)   . Hyperglycemia   . Macrocytosis without anemia   . Peripheral neuropathy   . Psoriasis   . PTSD (post-traumatic stress disorder)    Past Surgical History:  Procedure Laterality Date  . BACK SURGERY    . REPLACEMENT TOTAL KNEE BILATERAL       Current Outpatient Medications  Medication Sig Dispense Refill  . ALPRAZolam (XANAX) 0.5 MG tablet Take 0.5 mg by mouth at bedtime as needed for anxiety.    . rosuvastatin (CRESTOR) 10 MG tablet Take 10 mg by mouth daily.    . tamsulosin (FLOMAX) 0.4 MG CAPS capsule Take 0.4 mg by mouth daily.    Marland Kitchen telmisartan (MICARDIS) 20 MG tablet TK 1 T PO QD  3   No current facility-administered medications for this visit.     Physical Exam BP 117/80 (BP Location:  Right Arm, Patient Position: Sitting, Cuff Size: Large)   Pulse 66   Resp 16   Ht 6' 1.76" (1.874 m)   Wt 226 lb (102.5 kg)   SpO2 98% Comment: ON RA  BMI 29.71 kg/m  72 year old man in no acute distress Alert and oriented x3 with no focal motor deficits No cervical or supraclavicular adenopathy Lungs clear, no rales or wheezing Cardiac regular rate and rhythm normal S1 and S2 Abdomen soft nontender Extremities are without clubbing cyanosis or edema  Diagnostic Tests: NUCLEAR MEDICINE PET SKULL BASE TO THIGH  TECHNIQUE: 11.2 mCi F-18 FDG was injected intravenously. Full-ring PET imaging was performed from the skull base to thigh after the radiotracer. CT data was obtained and used for attenuation correction and anatomic localization.  Fasting blood glucose: 135 mg/dl  COMPARISON:  CT chest 04/03/2018  FINDINGS: Mediastinal blood pool activity: SUV max 2.49  NECK: No hypermetabolic lymph nodes in the neck.  Incidental CT findings: none  CHEST: No hypermetabolic mediastinal or hilar lymph nodes.  Surrounded by an area of scarring within the lateral right upper lobe is a 1.1 cm nodule which has an SUV max of 3.62. No additional suspicious pulmonary nodules identified.  Incidental CT findings: Aortic atherosclerosis noted. Calcifications in the  RCA, LAD coronary arteries noted. Advanced changes of emphysema.  ABDOMEN/PELVIS: No abnormal hypermetabolic activity within the liver, pancreas, adrenal glands, or spleen. No hypermetabolic lymph nodes in the abdomen or pelvis.  Incidental CT findings: Aortic atherosclerosis.  No aneurysm.  SKELETON: No focal hypermetabolic activity to suggest skeletal metastasis.  Incidental CT findings: none  IMPRESSION: 1. Mild to moderate FDG uptake is associated with the peripheral right upper lobe lung nodule within SUV max of 3.6. Suspicious for small pulmonary neoplasm. 2. No hypermetabolic thoracic adenopathy or  evidence of distant metastatic disease. 3. Aortic Atherosclerosis (ICD10-I70.0) and Emphysema (ICD10-J43.9). 4. RCA, LAD coronary artery atherosclerotic calcifications.   Electronically Signed   By: Kerby Moors M.D.   On: 05/01/2018 13:46 I personally reviewed the PET/CT images and concur with the findings noted above  Pulmonary function testing FVC 4.89 (96%) FEV1 3.39 (90%), no change with bronchodilators TLC 6.30 (80%) DLCO 19.67 (52%)  Impression: Mr. Lisenby is a 72 year old gentleman with a history of heavy tobacco abuse (80 pack years, quit 10 years ago) who was found to have a new right upper lobe spiculated nodule on low-dose CT screening.  On PET the nodule is hypermetabolic with an SUV of 3.6.  There is no evidence of regional or distant metastatic disease.  Had a long discussion with Mr. Lindaman.  I informed him that given his age, smoking history, appearance of the nodule, and PET findings that this most likely is a new primary bronchogenic carcinoma.  Clinically it would be a T1, N0 lesion.  This is potentially curable treated appropriately.  We discussed treatment options including surgical resection and radiation.  He understands the chances of survival are better with a surgical resection and staging is more accurate as well.  We discussed issues related to biopsies.  His case would be difficult to biopsy this lesion bronchoscopically because of his peripheral nature.  A negative biopsy would not rule out cancer.  CT-guided biopsy would be dangerous due to the surrounding emphysematous changes.  I described the general nature of the operation to him.  He understands the need for general anesthesia, the incisions to be used, the use of a drainage tube postoperatively, and the expected hospital stay and overall recovery.  I informed him of the indications, risks, benefits, and alternatives.  He understands the risks include, but are not limited to death, MI, DVT, PE,  bleeding, possible need for transfusion, infection, prolonged air leak, cardiac arrhythmias, as well as the possibility of other unforeseeable complications.  He does understand that chronic pain is a possibility although uncommon with a minimally invasive approach.  He understands the risks and wishes to proceed with surgical resection  Plan: Right VATS, wedge resection, possible right upper lobectomy on Monday, June 03, 2018  Melrose Nakayama, MD [facility] [phone]

## 2018-05-07 NOTE — H&P (View-Only) (Signed)
Joseph Decker [address]       [place] [address]             [phone]       HPI: Joseph Decker returns to discuss the results of his PET CT and pulmonary function testing  Joseph Decker is a 72 year old man with a history of tobacco abuse entheses 80 pack years), PTSD, anxiety, psoriasis, degenerative joint disease with previous bilateral knee replacements, lumbar disc disease, and peripheral neuropathy.  He started the low-dose screening CT program for lung cancer last year.  His initial scan did not show any suspicious nodules.  His scan this year showed a new 13 mm nodule in the right upper lobe.  This was a spiculated nodule peripherally in an area with significant emphysema.  He has problems with bilateral knee pain as well as lumbar spine pain and peripheral neuropathy.  Despite that he is able to exercise on a regular basis.  He rides his bike several miles a day.  He has not had any changes in appetite or weight loss.  He is very anxious about the results of his studies.  Past Medical History:  Diagnosis Date  . Arthritis   . CAD (coronary artery disease)   . DDD (degenerative disc disease), lumbar   . Enlarged prostate   . GERD (gastroesophageal reflux disease)   . Hyperglycemia   . Macrocytosis without anemia   . Peripheral neuropathy   . Psoriasis   . PTSD (post-traumatic stress disorder)    Past Surgical History:  Procedure Laterality Date  . BACK SURGERY    . REPLACEMENT TOTAL KNEE BILATERAL       Current Outpatient Medications  Medication Sig Dispense Refill  . ALPRAZolam (XANAX) 0.5 MG tablet Take 0.5 mg by mouth at bedtime as needed for anxiety.    . rosuvastatin (CRESTOR) 10 MG tablet Take 10 mg by mouth daily.    . tamsulosin (FLOMAX) 0.4 MG CAPS capsule Take 0.4 mg by mouth daily.    Marland Kitchen telmisartan (MICARDIS) 20 MG tablet TK 1 T PO QD  3   No current facility-administered medications for this visit.     Physical Exam BP 117/80 (BP Location:  Right Arm, Patient Position: Sitting, Cuff Size: Large)   Pulse 66   Resp 16   Ht 6' 1.76" (1.874 m)   Wt 226 lb (102.5 kg)   SpO2 98% Comment: ON RA  BMI 29.44 kg/m  72 year old man in no acute distress Alert and oriented x3 with no focal motor deficits No cervical or supraclavicular adenopathy Lungs clear, no rales or wheezing Cardiac regular rate and rhythm normal S1 and S2 Abdomen soft nontender Extremities are without clubbing cyanosis or edema  Diagnostic Tests: NUCLEAR MEDICINE PET SKULL BASE TO THIGH  TECHNIQUE: 11.2 mCi F-18 FDG was injected intravenously. Full-ring PET imaging was performed from the skull base to thigh after the radiotracer. CT data was obtained and used for attenuation correction and anatomic localization.  Fasting blood glucose: 135 mg/dl  COMPARISON:  CT chest 04/03/2018  FINDINGS: Mediastinal blood pool activity: SUV max 2.49  NECK: No hypermetabolic lymph nodes in the neck.  Incidental CT findings: none  CHEST: No hypermetabolic mediastinal or hilar lymph nodes.  Surrounded by an area of scarring within the lateral right upper lobe is a 1.1 cm nodule which has an SUV max of 3.62. No additional suspicious pulmonary nodules identified.  Incidental CT findings: Aortic atherosclerosis noted. Calcifications in the  RCA, LAD coronary arteries noted. Advanced changes of emphysema.  ABDOMEN/PELVIS: No abnormal hypermetabolic activity within the liver, pancreas, adrenal glands, or spleen. No hypermetabolic lymph nodes in the abdomen or pelvis.  Incidental CT findings: Aortic atherosclerosis.  No aneurysm.  SKELETON: No focal hypermetabolic activity to suggest skeletal metastasis.  Incidental CT findings: none  IMPRESSION: 1. Mild to moderate FDG uptake is associated with the peripheral right upper lobe lung nodule within SUV max of 3.6. Suspicious for small pulmonary neoplasm. 2. No hypermetabolic thoracic adenopathy or  evidence of distant metastatic disease. 3. Aortic Atherosclerosis (ICD10-I70.0) and Emphysema (ICD10-J43.9). 4. RCA, LAD coronary artery atherosclerotic calcifications.   Electronically Signed   By: Kerby Moors M.D.   On: 05/01/2018 13:46 I personally reviewed the PET/CT images and concur with the findings noted above  Pulmonary function testing FVC 4.89 (96%) FEV1 3.39 (90%), no change with bronchodilators TLC 6.30 (80%) DLCO 19.67 (52%)  Impression: Joseph Decker is a 72 year old gentleman with a history of heavy tobacco abuse (80 pack years, quit 10 years ago) who was found to have a new right upper lobe spiculated nodule on low-dose CT screening.  On PET the nodule is hypermetabolic with an SUV of 3.6.  There is no evidence of regional or distant metastatic disease.  Had a long discussion with Joseph Decker.  I informed him that given his age, smoking history, appearance of the nodule, and PET findings that this most likely is a new primary bronchogenic carcinoma.  Clinically it would be a T1, N0 lesion.  This is potentially curable treated appropriately.  We discussed treatment options including surgical resection and radiation.  He understands the chances of survival are better with a surgical resection and staging is more accurate as well.  We discussed issues related to biopsies.  His case would be difficult to biopsy this lesion bronchoscopically because of his peripheral nature.  A negative biopsy would not rule out cancer.  CT-guided biopsy would be dangerous due to the surrounding emphysematous changes.  I described the general nature of the operation to him.  He understands the need for general anesthesia, the incisions to be used, the use of a drainage tube postoperatively, and the expected hospital stay and overall recovery.  I informed him of the indications, risks, benefits, and alternatives.  He understands the risks include, but are not limited to death, MI, DVT, PE,  bleeding, possible need for transfusion, infection, prolonged air leak, cardiac arrhythmias, as well as the possibility of other unforeseeable complications.  He does understand that chronic pain is a possibility although uncommon with a minimally invasive approach.  He understands the risks and wishes to proceed with surgical resection  Plan: Right VATS, wedge resection, possible right upper lobectomy on Monday, June 03, 2018  Joseph Nakayama, MD [facility] [phone]

## 2018-05-27 DIAGNOSIS — M5489 Other dorsalgia: Secondary | ICD-10-CM | POA: Diagnosis not present

## 2018-05-27 DIAGNOSIS — R918 Other nonspecific abnormal finding of lung field: Secondary | ICD-10-CM | POA: Diagnosis not present

## 2018-05-27 DIAGNOSIS — R7309 Other abnormal glucose: Secondary | ICD-10-CM | POA: Diagnosis not present

## 2018-05-27 DIAGNOSIS — Z6830 Body mass index (BMI) 30.0-30.9, adult: Secondary | ICD-10-CM | POA: Diagnosis not present

## 2018-05-30 ENCOUNTER — Encounter (HOSPITAL_COMMUNITY)
Admission: RE | Admit: 2018-05-30 | Discharge: 2018-05-30 | Disposition: A | Discharge status: Home / Self Care | Payer: Medicare Other | Source: Ambulatory Visit | Attending: Thoracic Surgery (Cardiothoracic Vascular Surgery) | Admitting: Thoracic Surgery (Cardiothoracic Vascular Surgery)

## 2018-05-30 ENCOUNTER — Other Ambulatory Visit: Payer: Self-pay

## 2018-05-30 ENCOUNTER — Other Ambulatory Visit (HOSPITAL_COMMUNITY): Payer: Self-pay | Admitting: *Deleted

## 2018-05-30 ENCOUNTER — Encounter (HOSPITAL_COMMUNITY): Payer: Self-pay

## 2018-05-30 DIAGNOSIS — Z01812 Encounter for preprocedural laboratory examination: Secondary | ICD-10-CM | POA: Diagnosis not present

## 2018-05-30 DIAGNOSIS — R911 Solitary pulmonary nodule: Secondary | ICD-10-CM

## 2018-05-30 DIAGNOSIS — Z01810 Encounter for preprocedural cardiovascular examination: Secondary | ICD-10-CM | POA: Insufficient documentation

## 2018-05-30 HISTORY — DX: Narcolepsy without cataplexy: G47.419

## 2018-05-30 HISTORY — DX: Essential (primary) hypertension: I10

## 2018-05-30 HISTORY — DX: Contact with and (suspected) exposure to other hazardous, chiefly nonmedicinal, chemicals: Z77.098

## 2018-05-30 HISTORY — DX: Headache, unspecified: R51.9

## 2018-05-30 HISTORY — DX: Pneumonia, unspecified organism: J18.9

## 2018-05-30 HISTORY — DX: Chronic obstructive pulmonary disease, unspecified: J44.9

## 2018-05-30 HISTORY — DX: Headache: R51

## 2018-05-30 HISTORY — DX: Abdominal aortic ectasia: I77.811

## 2018-05-30 HISTORY — DX: Prediabetes: R73.03

## 2018-05-30 HISTORY — DX: Hyperlipidemia, unspecified: E78.5

## 2018-05-30 LAB — BLOOD GAS, ARTERIAL
Acid-base deficit: 0.9 mmol/L (ref 0.0–2.0)
Bicarbonate: 22.8 mmol/L (ref 20.0–28.0)
DRAWN BY: 129711
FIO2: 21
O2 Saturation: 96.7 %
PCO2 ART: 35 mmHg (ref 32.0–48.0)
Patient temperature: 98.6
pH, Arterial: 7.43 (ref 7.350–7.450)
pO2, Arterial: 86.9 mmHg (ref 83.0–108.0)

## 2018-05-30 LAB — CBC
HEMATOCRIT: 42.3 % (ref 39.0–52.0)
HEMOGLOBIN: 14.5 g/dL (ref 13.0–17.0)
MCH: 34.1 pg — ABNORMAL HIGH (ref 26.0–34.0)
MCHC: 34.3 g/dL (ref 30.0–36.0)
MCV: 99.5 fL (ref 78.0–100.0)
Platelets: 149 10*3/uL — ABNORMAL LOW (ref 150–400)
RBC: 4.25 MIL/uL (ref 4.22–5.81)
RDW: 12.3 % (ref 11.5–15.5)
WBC: 5.4 10*3/uL (ref 4.0–10.5)

## 2018-05-30 LAB — COMPREHENSIVE METABOLIC PANEL
ALBUMIN: 3.9 g/dL (ref 3.5–5.0)
ALK PHOS: 74 U/L (ref 38–126)
ALT: 26 U/L (ref 17–63)
AST: 25 U/L (ref 15–41)
Anion gap: 8 (ref 5–15)
BILIRUBIN TOTAL: 0.7 mg/dL (ref 0.3–1.2)
BUN: 27 mg/dL — AB (ref 6–20)
CALCIUM: 9.2 mg/dL (ref 8.9–10.3)
CO2: 22 mmol/L (ref 22–32)
CREATININE: 1.17 mg/dL (ref 0.61–1.24)
Chloride: 109 mmol/L (ref 101–111)
GFR calc Af Amer: >60 mL/min (ref >60)
GLUCOSE: 132 mg/dL — AB (ref 65–99)
Potassium: 4 mmol/L (ref 3.5–5.1)
Sodium: 139 mmol/L (ref 135–145)
TOTAL PROTEIN: 6.6 g/dL (ref 6.5–8.1)

## 2018-05-30 LAB — SURGICAL PCR SCREEN
MRSA, PCR: NEGATIVE
Staphylococcus aureus: NEGATIVE

## 2018-05-30 LAB — TYPE AND SCREEN
ABO/RH(D): O POS
Antibody Screen: NEGATIVE

## 2018-05-30 LAB — URINALYSIS, ROUTINE W REFLEX MICROSCOPIC
Bilirubin Urine: NEGATIVE
Glucose, UA: NEGATIVE mg/dL
Hgb urine dipstick: NEGATIVE
Ketones, ur: NEGATIVE mg/dL
LEUKOCYTES UA: NEGATIVE
NITRITE: NEGATIVE
Protein, ur: NEGATIVE mg/dL
SPECIFIC GRAVITY, URINE: 1.029 (ref 1.005–1.030)
pH: 5 (ref 5.0–8.0)

## 2018-05-30 LAB — PROTIME-INR
INR: 1
Prothrombin Time: 13.1 seconds (ref 11.4–15.2)

## 2018-05-30 LAB — APTT: aPTT: 28 seconds (ref 24–36)

## 2018-05-30 NOTE — Progress Notes (Signed)
   05/30/18 1349  OBSTRUCTIVE SLEEP APNEA  Have you ever been diagnosed with sleep apnea through a sleep study? No  Do you snore loudly (loud enough to be heard through closed doors)?  1  Do you often feel tired, fatigued, or sleepy during the daytime (such as falling asleep during driving or talking to someone)? 1  Has anyone observed you stop breathing during your sleep? 0  Do you have, or are you being treated for high blood pressure? 1  BMI more than 35 kg/m2? 0  Age > 50 (1-yes) 1  Neck circumference greater than:Male 16 inches or larger, Male 17inches or larger? 1  Male Gender (Yes=1) 1  Obstructive Sleep Apnea Score 6  Score 5 or greater  Results sent to PCP

## 2018-05-30 NOTE — Pre-Procedure Instructions (Signed)
GIANG HEMME  05/30/2018    Your procedure is scheduled on Monday, June 03, 2018 at 7:30 AM.   Report to [facility] Entrance "A" Admitting Office at 5:30 AM.   Call this number if you have problems the morning of surgery: [phone]   Questions prior to day of surgery, please call [phone] between 8 & 4 PM.   Remember:  Do not eat food or drink liquids after midnight Sunday, 06/02/18.  Take these medicines the morning of surgery with A SIP OF WATER: Alprazolam (Xanax) - if needed, Tylenol - if needed  Stop Multivitamins, Vitamin E, Fish Oil, NSAIDS (Ibuprofen, Aleve, etc) and Herbal medications as of today. Do not use Aspirin products prior to surgery.     Do not wear jewelry.  Do not wear lotions, powders, cologne or deodorant.  Men may shave face and neck.  Do not bring valuables to the hospital.  [place] is not responsible for any belongings or valuables.  Contacts, dentures or bridgework may not be worn into surgery.  Leave your suitcase in the car.  After surgery it may be brought to your room.  For patients admitted to the hospital, discharge time will be determined by your treatment team.  [place] - Preparing for Surgery  Before surgery, you can play an important role.  Because skin is not sterile, your skin needs to be as free of germs as possible.  You can reduce the number of germs on you skin by washing with CHG (chlorahexidine gluconate) soap before surgery.  CHG is an antiseptic cleaner which kills germs and bonds with the skin to continue killing germs even after washing.  Oral Hygiene is also important in reducing the risk of infection.  Remember to brush your teeth with your regular toothpaste the morning of surgery.  Please DO NOT use if you have an allergy to CHG or antibacterial soaps.  If your skin becomes reddened/irritated stop using the CHG and inform your nurse when you arrive at Short Stay.  Do not shave (including legs and underarms)  for at least 48 hours prior to the first CHG shower.  You may shave your face.  Please follow these instructions carefully:   1.  Shower with CHG Soap the night before surgery and the morning of Surgery.  2.  If you choose to wash your hair, wash your hair first as usual with your normal shampoo.  3.  After you shampoo, rinse your hair and body thoroughly to remove the shampoo. 4.  Use CHG as you would any other liquid soap.  You can apply chg directly to the skin and wash gently with a      scrungie or washcloth.           5.  Apply the CHG Soap to your body ONLY FROM THE NECK DOWN.   Do not use on open wounds or open sores. Avoid contact with your eyes, ears, mouth and genitals (private parts).  Wash genitals (private parts) with your normal soap.  6.  Wash thoroughly, paying special attention to the area where your surgery will be performed.  7.  Thoroughly rinse your body with warm water from the neck down.  8.  DO NOT shower/wash with your normal soap after using and rinsing off the CHG Soap.  9.  Pat yourself dry with a clean towel.            10 .  Wear clean pajamas.  11.  Place clean sheets on your bed the night of your first shower and do not sleep with pets.  Day of Surgery  Shower as above. Do not apply any lotions/deodorants the morning of surgery.   Please wear clean clothes to the hospital. Remember to brush your teeth with toothpaste.   Please read over the fact sheets that you were given.

## 2018-05-30 NOTE — Progress Notes (Signed)
Pt has hx of an enlarged aorta. States Dr. Einar Gip follows him for that. States he's had an Echo and stress test done at Dr. Irven Shelling office. Have requested those results. Pt states he's pre-diabetic. States he had an A1C done yesterday and it was 6.3. Pt states he checks his blood sugar at home and it always between 103-107. Pt is a [place] vet and has PTSD and anxiety.

## 2018-05-31 ENCOUNTER — Encounter (HOSPITAL_COMMUNITY): Payer: Self-pay

## 2018-05-31 NOTE — Progress Notes (Signed)
Anesthesia Chart Review:  Case:  [id number] Date/Time:  06/03/18 0715   Procedures:      VIDEO ASSISTED THORACOSCOPY (VATS)/WEDGE RESECTION (Right Chest)     possible right upper LOBECTOMY (Right )   Anesthesia type:  General   Pre-op diagnosis:  RUL NODULE   Location:  [facility] OR ROOM 10 / [place] OR   Surgeon:  Melrose Nakayama, MD      DISCUSSION: Patient is a 72 year old male scheduled for the above procedure.  History includes former smoker (quit '10), hypertension, emphysema, CAD (coronary atherosclerosis on CT 04/10/17), ectatic abdominal aorta (2.7 cm, 5 year f/u recommended; due ~ 2023), PTSD ([place] War veteran), agent orange exposure, psoriasis, pre-diabetes.  He had cardiology evaluation with testing last year (see below).  Based on currently available information anticipate that he can proceed as planned if no acute changes.  VS: BP 122/74   Pulse 86   Temp 36.4 C   Resp 20   Ht '6\' 2"'  (1.88 m)   Wt 234 lb 8 oz (106.4 kg)   SpO2 97%   BMI 30.11 kg/m   PROVIDERS: Patient, No Pcp Per is listed. Cardiology notes list Velna Hatchet, MD. Kela Millin, MD is cardiologist [facility]). Last visit 06/07/17. He had coronary atherosclerosis by chest CT.  He was asymptomatic and was quite active at that time participating in kayaking, yard work and cycling. ETT and echo done.  Dyspnea felt due to moderate emphysema.  5-year follow-up for ectatic abdominal aorta advised, but as needed cardiology follow-up recommended.   LABS: Labs reviewed: Acceptable for surgery. (all labs ordered are listed, but only abnormal results are displayed)  Labs Reviewed  CBC - Abnormal; Notable for the following components:      Result Value   MCH 34.1 (*)    Platelets 149 (*)    All other components within normal limits  COMPREHENSIVE METABOLIC PANEL - Abnormal; Notable for the following components:   Glucose, Bld 132 (*)    BUN 27 (*)    All other components within normal limits   SURGICAL PCR SCREEN  APTT  BLOOD GAS, ARTERIAL  PROTIME-INR  URINALYSIS, ROUTINE W REFLEX MICROSCOPIC  TYPE AND SCREEN    IMAGES: PET scan 05/01/18: IMPRESSION: 1. Mild to moderate FDG uptake is associated with the peripheral right upper lobe lung nodule within SUV max of 3.6. Suspicious for small pulmonary neoplasm. 2. No hypermetabolic thoracic adenopathy or evidence of distant metastatic disease. 3. Aortic Atherosclerosis (ICD10-I70.0) and Emphysema (ICD10-J43.9). 4. RCA, LAD coronary artery atherosclerotic calcifications.  CT Chest (lung CA screen) 04/03/18: Impression: 1. Lung-RADS 4B, suspicious.  Additional imaging evaluation or consultation with pulmonology or thoracic surgery recommended. 2.  Aortic atherosclerosis and emphysema. 3.  RCA and LAD coronary artery calcifications.  OTHER: PFTs 05/02/18: FVC 4.89 (96%), FEV1 3.39 (90%), DLCO unc 19.67 (52%).   EKG: 05/30/18 EKG: Normal sinus rhythm, nonspecific ST abnormality.   CV: Treadmill exercise stress test 05/14/17 [facility] CV):  Resting EKG demonstrates normal sinus rhythm.  Small "q" in inferior and lateral leads.  The patient exercised according to Bruce protocol.  Total time recorded 6:16 minutes achieving maximum heart rate of 148 which was 98% of target heart rate for age and 7.42 METS of work.  Stress terminated due to fatigue and THR (> 85% MPHR)/MPHR met.  Normal BP response.  There were no ST-T changes of ischemia with exercise stress test.  There were no significant cardiac arrhythmias.  Normal BP response. No evidence  of ischemia by GXT. Exercise tolerance is low normal.  Continue preventive therapy.  Echo 05/08/17 [facility] CV):  Left ventricle cavity is normal in size.  Moderate concentric hypertrophy of the left ventricle.  Normal global wall motion.  Normal diastolic filling pattern.  Calculated EF 69%.  Trace aortic regurgitation.  Trace mitral regurgitation.  Mild tricuspid regurgitation.  No evidence of  pulmonary hypertension.  Abdominal aorta Duplex 04/10/17 [facility] CV): Atherosclerosis.  Maximum diameter of the abdominal aorta is 2.7 cm.  Ectatic abdominal aorta at risk for aneurysm developed and, recommend rescreening in 5 years.   Past Medical History:  Diagnosis Date  . Agent orange exposure   . Arthritis   . CAD (coronary artery disease)    coronary calcification on CT 04/10/17  . COPD (chronic obstructive pulmonary disease) (HCC)    emphysema  . DDD (degenerative disc disease), lumbar   . Ectatic abdominal aorta ([place])    04/10/17 U/S: 2.7 cm abdominal aorta, recommend rescreening in 5 years (Dr. Christen Butter)  . Enlarged prostate   . GERD (gastroesophageal reflux disease)   . Hyperglycemia   . Hyperlipidemia   . Hypertension   . Macrocytosis without anemia   . Narcolepsy    pt states he is self diagnosed  . Peripheral neuropathy   . Pneumonia    during his service in [place]  . Pre-diabetes   . Psoriasis   . PTSD (post-traumatic stress disorder)    [place] war veteran  . Sinus headache     Past Surgical History:  Procedure Laterality Date  . BACK SURGERY     lumbar discectomy  . COLONOSCOPY    . REPLACEMENT TOTAL KNEE BILATERAL    . TONSILLECTOMY      MEDICATIONS: . methocarbamol (ROBAXIN) 500 MG tablet  . acetaminophen (TYLENOL) 500 MG tablet  . ALPRAZolam (XANAX) 0.5 MG tablet  . Biotin 5000 MCG TABS  . calcipotriene (DOVONOX) 0.005 % cream  . Calcium Carb-Cholecalciferol (CALCIUM 600+D3 PO)  . Cholecalciferol (VITAMIN D-3) 5000 units TABS  . clobetasol (OLUX) 0.05 % topical foam  . Glucosamine-Chondroitin (OSTEO BI-FLEX REGULAR STRENGTH PO)  . ibuprofen (ADVIL,MOTRIN) 200 MG tablet  . Multiple Vitamin (MULTIVITAMIN WITH MINERALS) TABS tablet  . Multiple Vitamins-Minerals (PRESERVISION AREDS 2 PO)  . naproxen (NAPROXEN DR) 500 MG EC tablet  . Omega-3 Fatty Acids (EQL OMEGA 3 FISH OIL) 1400 MG CAPS  . Polyethyl Glycol-Propyl Glycol (SYSTANE) 0.4-0.3 %  SOLN  . rosuvastatin (CRESTOR) 10 MG tablet  . tamsulosin (FLOMAX) 0.4 MG CAPS capsule  . telmisartan (MICARDIS) 20 MG tablet  . triamcinolone (KENALOG) 0.025 % ointment  . vitamin E (VITAMIN E) 400 UNIT capsule   No current facility-administered medications for this encounter.     George Hugh [facility]/Anesthesiology Phone [phone] 05/31/2018 10:04 AM

## 2018-06-03 ENCOUNTER — Other Ambulatory Visit: Payer: Self-pay

## 2018-06-03 ENCOUNTER — Inpatient Hospital Stay (HOSPITAL_COMMUNITY): Payer: Medicare Other

## 2018-06-03 ENCOUNTER — Inpatient Hospital Stay (HOSPITAL_COMMUNITY): Payer: Medicare Other | Admitting: Vascular Surgery

## 2018-06-03 ENCOUNTER — Inpatient Hospital Stay (HOSPITAL_COMMUNITY): Payer: Medicare Other | Admitting: Certified Registered Nurse Anesthetist

## 2018-06-03 ENCOUNTER — Encounter (HOSPITAL_COMMUNITY): Payer: Self-pay | Admitting: *Deleted

## 2018-06-03 ENCOUNTER — Encounter (HOSPITAL_COMMUNITY)
Admission: RE | Disposition: A | Discharge status: Home / Self Care | Payer: Self-pay | Source: Ambulatory Visit | Attending: Thoracic Surgery (Cardiothoracic Vascular Surgery)

## 2018-06-03 ENCOUNTER — Inpatient Hospital Stay (HOSPITAL_COMMUNITY)
Admission: RE | Admit: 2018-06-03 | Discharge: 2018-06-06 | DRG: 165 | Disposition: A | Discharge status: Home / Self Care | Payer: Medicare Other | Source: Ambulatory Visit | Attending: Thoracic Surgery (Cardiothoracic Vascular Surgery) | Admitting: Thoracic Surgery (Cardiothoracic Vascular Surgery)

## 2018-06-03 DIAGNOSIS — J449 Chronic obstructive pulmonary disease, unspecified: Secondary | ICD-10-CM | POA: Diagnosis not present

## 2018-06-03 DIAGNOSIS — R7303 Prediabetes: Secondary | ICD-10-CM | POA: Diagnosis present

## 2018-06-03 DIAGNOSIS — F4310 Post-traumatic stress disorder, unspecified: Secondary | ICD-10-CM | POA: Diagnosis present

## 2018-06-03 DIAGNOSIS — Z87891 Personal history of nicotine dependence: Secondary | ICD-10-CM

## 2018-06-03 DIAGNOSIS — I2510 Atherosclerotic heart disease of native coronary artery without angina pectoris: Secondary | ICD-10-CM | POA: Diagnosis present

## 2018-06-03 DIAGNOSIS — G629 Polyneuropathy, unspecified: Secondary | ICD-10-CM | POA: Diagnosis not present

## 2018-06-03 DIAGNOSIS — Z79899 Other long term (current) drug therapy: Secondary | ICD-10-CM | POA: Diagnosis not present

## 2018-06-03 DIAGNOSIS — K219 Gastro-esophageal reflux disease without esophagitis: Secondary | ICD-10-CM | POA: Diagnosis not present

## 2018-06-03 DIAGNOSIS — J939 Pneumothorax, unspecified: Secondary | ICD-10-CM | POA: Diagnosis not present

## 2018-06-03 DIAGNOSIS — Z4682 Encounter for fitting and adjustment of non-vascular catheter: Secondary | ICD-10-CM | POA: Diagnosis not present

## 2018-06-03 DIAGNOSIS — F419 Anxiety disorder, unspecified: Secondary | ICD-10-CM | POA: Diagnosis present

## 2018-06-03 DIAGNOSIS — Z01818 Encounter for other preprocedural examination: Secondary | ICD-10-CM

## 2018-06-03 DIAGNOSIS — G8918 Other acute postprocedural pain: Secondary | ICD-10-CM | POA: Diagnosis not present

## 2018-06-03 DIAGNOSIS — Z96653 Presence of artificial knee joint, bilateral: Secondary | ICD-10-CM | POA: Diagnosis present

## 2018-06-03 DIAGNOSIS — R911 Solitary pulmonary nodule: Secondary | ICD-10-CM | POA: Diagnosis not present

## 2018-06-03 DIAGNOSIS — N400 Enlarged prostate without lower urinary tract symptoms: Secondary | ICD-10-CM | POA: Diagnosis present

## 2018-06-03 DIAGNOSIS — J8410 Pulmonary fibrosis, unspecified: Secondary | ICD-10-CM | POA: Diagnosis not present

## 2018-06-03 DIAGNOSIS — J849 Interstitial pulmonary disease, unspecified: Secondary | ICD-10-CM | POA: Diagnosis not present

## 2018-06-03 DIAGNOSIS — J439 Emphysema, unspecified: Secondary | ICD-10-CM | POA: Diagnosis present

## 2018-06-03 DIAGNOSIS — J850 Gangrene and necrosis of lung: Secondary | ICD-10-CM | POA: Diagnosis not present

## 2018-06-03 DIAGNOSIS — J984 Other disorders of lung: Secondary | ICD-10-CM | POA: Diagnosis not present

## 2018-06-03 DIAGNOSIS — I10 Essential (primary) hypertension: Secondary | ICD-10-CM | POA: Diagnosis not present

## 2018-06-03 HISTORY — PX: VIDEO ASSISTED THORACOSCOPY (VATS)/WEDGE RESECTION: SHX6174

## 2018-06-03 LAB — GLUCOSE, CAPILLARY
GLUCOSE-CAPILLARY: 136 mg/dL — AB (ref 65–99)
GLUCOSE-CAPILLARY: 271 mg/dL — AB (ref 65–99)
Glucose-Capillary: 216 mg/dL — ABNORMAL HIGH (ref 65–99)

## 2018-06-03 SURGERY — VIDEO ASSISTED THORACOSCOPY (VATS)/WEDGE RESECTION
Anesthesia: General | Site: Chest | Laterality: Right

## 2018-06-03 MED ORDER — IRBESARTAN 150 MG PO TABS
75.0000 mg | ORAL_TABLET | Freq: Every day | ORAL | Status: DC
  Administered 2018-06-04 – 2018-06-06 (×3): 75 mg via ORAL
  Filled 2018-06-03 (×3): qty 1

## 2018-06-03 MED ORDER — ONDANSETRON HCL 4 MG/2ML IJ SOLN: 4.0000 mg | Freq: Four times a day (QID) | INTRAMUSCULAR | Status: DC | PRN

## 2018-06-03 MED ORDER — 0.9 % SODIUM CHLORIDE (POUR BTL) OPTIME
TOPICAL | Status: DC | PRN
  Administered 2018-06-03: 2000 mL

## 2018-06-03 MED ORDER — PRESERVISION AREDS 2 PO CAPS: 2.0000 | ORAL_CAPSULE | Freq: Two times a day (BID) | ORAL | Status: DC

## 2018-06-03 MED ORDER — TAMSULOSIN HCL 0.4 MG PO CAPS
0.4000 mg | ORAL_CAPSULE | Freq: Every day | ORAL | Status: DC
  Administered 2018-06-03 – 2018-06-05 (×3): 0.4 mg via ORAL
  Filled 2018-06-03 (×3): qty 1

## 2018-06-03 MED ORDER — BISACODYL 5 MG PO TBEC
10.0000 mg | DELAYED_RELEASE_TABLET | Freq: Every day | ORAL | Status: DC
  Administered 2018-06-03 – 2018-06-04 (×2): 10 mg via ORAL
  Filled 2018-06-03 (×3): qty 2

## 2018-06-03 MED ORDER — POTASSIUM CHLORIDE 10 MEQ/50ML IV SOLN: 10.0000 meq | Freq: Every day | INTRAVENOUS | Status: DC | PRN

## 2018-06-03 MED ORDER — PANTOPRAZOLE SODIUM 40 MG PO TBEC
40.0000 mg | DELAYED_RELEASE_TABLET | Freq: Two times a day (BID) | ORAL | Status: DC
  Administered 2018-06-03 – 2018-06-06 (×6): 40 mg via ORAL
  Filled 2018-06-03 (×6): qty 1

## 2018-06-03 MED ORDER — FENTANYL CITRATE (PF) 100 MCG/2ML IJ SOLN
25.0000 ug | INTRAMUSCULAR | Status: DC | PRN
  Administered 2018-06-03 (×3): 50 ug via INTRAVENOUS

## 2018-06-03 MED ORDER — ACETAMINOPHEN 160 MG/5ML PO SOLN: 1000.0000 mg | Freq: Four times a day (QID) | ORAL | Status: DC

## 2018-06-03 MED ORDER — CEFAZOLIN SODIUM-DEXTROSE 2-4 GM/100ML-% IV SOLN
INTRAVENOUS | Status: AC
  Filled 2018-06-03: qty 100

## 2018-06-03 MED ORDER — ACETAMINOPHEN 500 MG PO TABS
1000.0000 mg | ORAL_TABLET | Freq: Four times a day (QID) | ORAL | Status: DC
  Administered 2018-06-03 – 2018-06-05 (×7): 1000 mg via ORAL
  Filled 2018-06-03 (×7): qty 2

## 2018-06-03 MED ORDER — MIDAZOLAM HCL 2 MG/2ML IJ SOLN
INTRAMUSCULAR | Status: AC
  Filled 2018-06-03: qty 2

## 2018-06-03 MED ORDER — SODIUM CHLORIDE 0.9% FLUSH: 9.0000 mL | INTRAVENOUS | Status: DC | PRN

## 2018-06-03 MED ORDER — PHENYLEPHRINE HCL 10 MG/ML IJ SOLN
INTRAMUSCULAR | Status: DC | PRN
  Administered 2018-06-03: 25 ug/min via INTRAVENOUS

## 2018-06-03 MED ORDER — FENTANYL CITRATE (PF) 250 MCG/5ML IJ SOLN
INTRAMUSCULAR | Status: DC | PRN
  Administered 2018-06-03: 150 ug via INTRAVENOUS
  Administered 2018-06-03 (×4): 50 ug via INTRAVENOUS

## 2018-06-03 MED ORDER — DEXAMETHASONE SODIUM PHOSPHATE 10 MG/ML IJ SOLN
INTRAMUSCULAR | Status: DC | PRN
  Administered 2018-06-03: 5 mg via INTRAVENOUS

## 2018-06-03 MED ORDER — FENTANYL CITRATE (PF) 100 MCG/2ML IJ SOLN
INTRAMUSCULAR | Status: AC
  Administered 2018-06-03: 50 ug via INTRAVENOUS
  Filled 2018-06-03: qty 2

## 2018-06-03 MED ORDER — SODIUM CHLORIDE 0.9 % IJ SOLN
INTRAMUSCULAR | Status: DC | PRN
  Administered 2018-06-03: 50 mL via INTRAVENOUS

## 2018-06-03 MED ORDER — FENTANYL CITRATE (PF) 250 MCG/5ML IJ SOLN
INTRAMUSCULAR | Status: AC
  Filled 2018-06-03: qty 10

## 2018-06-03 MED ORDER — DIPHENHYDRAMINE HCL 50 MG/ML IJ SOLN: 12.5000 mg | Freq: Four times a day (QID) | INTRAMUSCULAR | Status: DC | PRN

## 2018-06-03 MED ORDER — INSULIN ASPART 100 UNIT/ML ~~LOC~~ SOLN
0.0000 [IU] | Freq: Four times a day (QID) | SUBCUTANEOUS | Status: DC
  Administered 2018-06-03: 8 [IU] via SUBCUTANEOUS
  Administered 2018-06-03: 12 [IU] via SUBCUTANEOUS
  Administered 2018-06-04: 8 [IU] via SUBCUTANEOUS

## 2018-06-03 MED ORDER — MIDAZOLAM HCL 2 MG/2ML IJ SOLN
INTRAMUSCULAR | Status: DC | PRN
  Administered 2018-06-03: 2 mg via INTRAVENOUS

## 2018-06-03 MED ORDER — PROPOFOL 10 MG/ML IV BOLUS
INTRAVENOUS | Status: AC
  Filled 2018-06-03: qty 40

## 2018-06-03 MED ORDER — FENTANYL 40 MCG/ML IV SOLN
INTRAVENOUS | Status: DC
  Administered 2018-06-03: 10 ug via INTRAVENOUS
  Administered 2018-06-03: 1000 ug via INTRAVENOUS
  Administered 2018-06-03: 30 ug via INTRAVENOUS
  Administered 2018-06-03: 10 ug via INTRAVENOUS
  Administered 2018-06-04 (×2): 20 ug via INTRAVENOUS
  Administered 2018-06-04 (×2): 10 ug via INTRAVENOUS
  Administered 2018-06-04 – 2018-06-05 (×2): 20 ug via INTRAVENOUS
  Administered 2018-06-05: 0 ug via INTRAVENOUS
  Filled 2018-06-03: qty 25

## 2018-06-03 MED ORDER — ALPRAZOLAM 0.5 MG PO TABS: 0.5000 mg | ORAL_TABLET | Freq: Every day | ORAL | Status: DC | PRN

## 2018-06-03 MED ORDER — BUPIVACAINE LIPOSOME 1.3 % IJ SUSP
20.0000 mL | INTRAMUSCULAR | Status: AC
  Administered 2018-06-03: 20 mL
  Filled 2018-06-03: qty 20

## 2018-06-03 MED ORDER — ESMOLOL HCL 100 MG/10ML IV SOLN
INTRAVENOUS | Status: DC | PRN
  Administered 2018-06-03: 20 mg via INTRAVENOUS

## 2018-06-03 MED ORDER — ORAL CARE MOUTH RINSE: 15.0000 mL | Freq: Two times a day (BID) | OROMUCOSAL | Status: DC

## 2018-06-03 MED ORDER — CEFAZOLIN SODIUM-DEXTROSE 2-4 GM/100ML-% IV SOLN
2.0000 g | Freq: Three times a day (TID) | INTRAVENOUS | Status: AC
  Administered 2018-06-03 (×2): 2 g via INTRAVENOUS
  Filled 2018-06-03 (×2): qty 100

## 2018-06-03 MED ORDER — SENNOSIDES-DOCUSATE SODIUM 8.6-50 MG PO TABS
1.0000 | ORAL_TABLET | Freq: Every day | ORAL | Status: DC
  Administered 2018-06-05: 1 via ORAL
  Filled 2018-06-03: qty 1

## 2018-06-03 MED ORDER — ONDANSETRON HCL 4 MG/2ML IJ SOLN: 4.0000 mg | Freq: Once | INTRAMUSCULAR | Status: DC | PRN

## 2018-06-03 MED ORDER — ONDANSETRON HCL 4 MG/2ML IJ SOLN
INTRAMUSCULAR | Status: DC | PRN
  Administered 2018-06-03: 4 mg via INTRAVENOUS

## 2018-06-03 MED ORDER — LUNG SURGERY BOOK
Freq: Once | Status: AC
  Administered 2018-06-03: 15:00:00
  Filled 2018-06-03: qty 1

## 2018-06-03 MED ORDER — ROSUVASTATIN CALCIUM 10 MG PO TABS
10.0000 mg | ORAL_TABLET | ORAL | Status: DC
  Administered 2018-06-03 – 2018-06-05 (×2): 10 mg via ORAL
  Filled 2018-06-03 (×2): qty 1

## 2018-06-03 MED ORDER — LACTATED RINGERS IV SOLN
INTRAVENOUS | Status: DC | PRN
  Administered 2018-06-03 (×2): via INTRAVENOUS

## 2018-06-03 MED ORDER — ROCURONIUM BROMIDE 10 MG/ML (PF) SYRINGE
PREFILLED_SYRINGE | INTRAVENOUS | Status: DC | PRN
  Administered 2018-06-03: 20 mg via INTRAVENOUS
  Administered 2018-06-03: 50 mg via INTRAVENOUS
  Administered 2018-06-03: 30 mg via INTRAVENOUS

## 2018-06-03 MED ORDER — ENOXAPARIN SODIUM 40 MG/0.4ML ~~LOC~~ SOLN
40.0000 mg | SUBCUTANEOUS | Status: DC
  Administered 2018-06-03 – 2018-06-05 (×3): 40 mg via SUBCUTANEOUS
  Filled 2018-06-03 (×3): qty 0.4

## 2018-06-03 MED ORDER — SUGAMMADEX SODIUM 200 MG/2ML IV SOLN
INTRAVENOUS | Status: DC | PRN
  Administered 2018-06-03: 400 mg via INTRAVENOUS

## 2018-06-03 MED ORDER — PROSIGHT PO TABS
1.0000 | ORAL_TABLET | Freq: Every day | ORAL | Status: DC
  Administered 2018-06-03 – 2018-06-06 (×4): 1 via ORAL
  Filled 2018-06-03 (×4): qty 1

## 2018-06-03 MED ORDER — NALOXONE HCL 0.4 MG/ML IJ SOLN
0.4000 mg | INTRAMUSCULAR | Status: DC | PRN
Start: 2018-06-03 — End: 2018-06-06

## 2018-06-03 MED ORDER — CEFAZOLIN SODIUM-DEXTROSE 2-4 GM/100ML-% IV SOLN
2.0000 g | INTRAVENOUS | Status: AC
  Administered 2018-06-03: 2 g via INTRAVENOUS

## 2018-06-03 MED ORDER — PROPOFOL 10 MG/ML IV BOLUS
INTRAVENOUS | Status: DC | PRN
  Administered 2018-06-03: 170 mg via INTRAVENOUS
  Administered 2018-06-03: 30 mg via INTRAVENOUS

## 2018-06-03 MED ORDER — ALUM & MAG HYDROXIDE-SIMETH 200-200-20 MG/5ML PO SUSP
30.0000 mL | Freq: Four times a day (QID) | ORAL | Status: DC | PRN
  Filled 2018-06-03: qty 30

## 2018-06-03 MED ORDER — KCL IN DEXTROSE-NACL 20-5-0.9 MEQ/L-%-% IV SOLN
INTRAVENOUS | Status: DC
  Administered 2018-06-03 – 2018-06-04 (×2): via INTRAVENOUS
  Filled 2018-06-03 (×3): qty 1000

## 2018-06-03 MED ORDER — LIDOCAINE 2% (20 MG/ML) 5 ML SYRINGE
INTRAMUSCULAR | Status: DC | PRN
  Administered 2018-06-03: 20 mg via INTRAVENOUS

## 2018-06-03 MED ORDER — KCL IN DEXTROSE-NACL 20-5-0.45 MEQ/L-%-% IV SOLN
INTRAVENOUS | Status: AC
  Administered 2018-06-03: 1000 mL
  Filled 2018-06-03: qty 1000

## 2018-06-03 MED ORDER — DIPHENHYDRAMINE HCL 12.5 MG/5ML PO ELIX
12.5000 mg | ORAL_SOLUTION | Freq: Four times a day (QID) | ORAL | Status: DC | PRN
Start: 2018-06-03 — End: 2018-06-06
  Filled 2018-06-03 (×2): qty 5

## 2018-06-03 MED ORDER — HEMOSTATIC AGENTS (NO CHARGE) OPTIME
TOPICAL | Status: DC | PRN
  Administered 2018-06-03: 1 via TOPICAL

## 2018-06-03 SURGICAL SUPPLY — 86 items
CANISTER SUCT 3000ML PPV (MISCELLANEOUS) ×4 IMPLANT
CATH THORACIC 28FR (CATHETERS) IMPLANT
CATH THORACIC 36FR (CATHETERS) IMPLANT
CATH THORACIC 36FR RT ANG (CATHETERS) IMPLANT
CLIP VESOCCLUDE MED 6/CT (CLIP) IMPLANT
CONN ST 1/4X3/8  BEN (MISCELLANEOUS)
CONN ST 1/4X3/8 BEN (MISCELLANEOUS) IMPLANT
CONN Y 3/8X3/8X3/8  BEN (MISCELLANEOUS)
CONN Y 3/8X3/8X3/8 BEN (MISCELLANEOUS) IMPLANT
CONT SPEC 4OZ CLIKSEAL STRL BL (MISCELLANEOUS) ×8 IMPLANT
CONT SPECI 4OZ STER CLIK (MISCELLANEOUS) ×4 IMPLANT
COVER SURGICAL LIGHT HANDLE (MISCELLANEOUS) ×4 IMPLANT
DERMABOND ADVANCED (GAUZE/BANDAGES/DRESSINGS) ×2
DERMABOND ADVANCED .7 DNX12 (GAUZE/BANDAGES/DRESSINGS) ×2 IMPLANT
DRAIN CHANNEL 28F RND 3/8 FF (WOUND CARE) IMPLANT
DRAIN CHANNEL 32F RND 10.7 FF (WOUND CARE) IMPLANT
DRAPE LAPAROSCOPIC ABDOMINAL (DRAPES) ×4 IMPLANT
DRAPE SLUSH/WARMER DISC (DRAPES) ×4 IMPLANT
DRAPE WARM FLUID 44X44 (DRAPE) IMPLANT
ELECT BLADE 6.5 EXT (BLADE) ×4 IMPLANT
ELECT REM PT RETURN 9FT ADLT (ELECTROSURGICAL) ×4
ELECTRODE REM PT RTRN 9FT ADLT (ELECTROSURGICAL) ×2 IMPLANT
GAUZE SPONGE 4X4 12PLY STRL (GAUZE/BANDAGES/DRESSINGS) IMPLANT
GAUZE SPONGE 4X4 12PLY STRL LF (GAUZE/BANDAGES/DRESSINGS) ×4 IMPLANT
GLOVE BIO SURGEON STRL SZ 6.5 (GLOVE) ×6 IMPLANT
GLOVE BIO SURGEONS STRL SZ 6.5 (GLOVE) ×2
GLOVE BIOGEL PI IND STRL 6.5 (GLOVE) ×8 IMPLANT
GLOVE BIOGEL PI INDICATOR 6.5 (GLOVE) ×8
GLOVE SURG SIGNA 7.5 PF LTX (GLOVE) ×8 IMPLANT
GOWN STRL REUS W/ TWL LRG LVL3 (GOWN DISPOSABLE) ×6 IMPLANT
GOWN STRL REUS W/ TWL XL LVL3 (GOWN DISPOSABLE) ×2 IMPLANT
GOWN STRL REUS W/TWL LRG LVL3 (GOWN DISPOSABLE) ×6
GOWN STRL REUS W/TWL XL LVL3 (GOWN DISPOSABLE) ×2
HEMOSTAT SURGICEL 2X14 (HEMOSTASIS) ×4 IMPLANT
KIT BASIN OR (CUSTOM PROCEDURE TRAY) ×4 IMPLANT
KIT SUCTION CATH 14FR (SUCTIONS) ×4 IMPLANT
KIT TURNOVER KIT B (KITS) ×4 IMPLANT
NEEDLE HYPO 25GX1X1/2 BEV (NEEDLE) ×4 IMPLANT
NEEDLE SPNL 18GX3.5 QUINCKE PK (NEEDLE) IMPLANT
NEEDLE SPNL 22GX3.5 QUINCKE BK (NEEDLE) ×4 IMPLANT
NS IRRIG 1000ML POUR BTL (IV SOLUTION) ×8 IMPLANT
PACK CHEST (CUSTOM PROCEDURE TRAY) ×4 IMPLANT
PAD ARMBOARD 7.5X6 YLW CONV (MISCELLANEOUS) ×8 IMPLANT
POUCH ENDO CATCH II 15MM (MISCELLANEOUS) IMPLANT
POUCH SPECIMEN RETRIEVAL 10MM (ENDOMECHANICALS) ×4 IMPLANT
RELOAD STAPLER GOLD 60MM (STAPLE) ×12 IMPLANT
SCISSORS ENDO CVD 5DCS (MISCELLANEOUS) IMPLANT
SEALANT PROGEL (MISCELLANEOUS) IMPLANT
SEALANT SURG COSEAL 4ML (VASCULAR PRODUCTS) IMPLANT
SEALANT SURG COSEAL 8ML (VASCULAR PRODUCTS) IMPLANT
SHEARS HARMONIC HDI 20CM (ELECTROSURGICAL) ×4 IMPLANT
SOLUTION ANTI FOG 6CC (MISCELLANEOUS) ×4 IMPLANT
SPECIMEN JAR MEDIUM (MISCELLANEOUS) IMPLANT
SPONGE INTESTINAL PEANUT (DISPOSABLE) ×4 IMPLANT
SPONGE TONSIL 1 RF SGL (DISPOSABLE) IMPLANT
STAPLE ECHEON FLEX 60 POW ENDO (STAPLE) ×4 IMPLANT
STAPLER RELOAD GOLD 60MM (STAPLE) ×24
SUT PROLENE 4 0 RB 1 (SUTURE)
SUT PROLENE 4-0 RB1 .5 CRCL 36 (SUTURE) IMPLANT
SUT SILK  1 MH (SUTURE) ×4
SUT SILK 1 MH (SUTURE) ×4 IMPLANT
SUT SILK 1 TIES 10X30 (SUTURE) ×4 IMPLANT
SUT SILK 2 0 SH (SUTURE) IMPLANT
SUT SILK 2 0SH CR/8 30 (SUTURE) IMPLANT
SUT SILK 3 0 SH 30 (SUTURE) IMPLANT
SUT SILK 3 0SH CR/8 30 (SUTURE) IMPLANT
SUT VIC AB 0 CTX 27 (SUTURE) IMPLANT
SUT VIC AB 1 CTX 27 (SUTURE) ×4 IMPLANT
SUT VIC AB 2-0 CT1 27 (SUTURE) ×2
SUT VIC AB 2-0 CT1 TAPERPNT 27 (SUTURE) ×2 IMPLANT
SUT VIC AB 3-0 MH 27 (SUTURE) IMPLANT
SUT VIC AB 3-0 SH 27 (SUTURE)
SUT VIC AB 3-0 SH 27X BRD (SUTURE) IMPLANT
SUT VIC AB 3-0 X1 27 (SUTURE) ×4 IMPLANT
SUT VICRYL 0 UR6 27IN ABS (SUTURE) IMPLANT
SUT VICRYL 2 TP 1 (SUTURE) IMPLANT
SYR 30ML LL (SYRINGE) ×4 IMPLANT
SYSTEM SAHARA CHEST DRAIN ATS (WOUND CARE) ×4 IMPLANT
TAPE CLOTH SURG 4X10 WHT LF (GAUZE/BANDAGES/DRESSINGS) ×4 IMPLANT
TIP APPLICATOR SPRAY EXTEND 16 (VASCULAR PRODUCTS) IMPLANT
TOWEL GREEN STERILE (TOWEL DISPOSABLE) ×4 IMPLANT
TOWEL GREEN STERILE FF (TOWEL DISPOSABLE) ×4 IMPLANT
TRAY FOLEY MTR SLVR 16FR STAT (SET/KITS/TRAYS/PACK) ×4 IMPLANT
TROCAR XCEL BLADELESS 5X75MML (TROCAR) ×4 IMPLANT
TROCAR XCEL NON-BLD 5MMX100MML (ENDOMECHANICALS) IMPLANT
WATER STERILE IRR 1000ML POUR (IV SOLUTION) ×8 IMPLANT

## 2018-06-03 NOTE — Brief Op Note (Addendum)
06/03/2018  9:50 AM  PATIENT:  Joseph Decker  72 y.o. male  PRE-OPERATIVE DIAGNOSIS:  RUL NODULE  POST-OPERATIVE DIAGNOSIS:  Granuloma right upper lobe  PROCEDURE:  RIGHT VIDEO ASSISTED THORACOSCOPY (VATS)/WEDGE RESECTION RUL for LUNG NODULE  SURGEON:  Surgeon(s) and Role:    Melrose Nakayama, MD - Primary  PHYSICIAN ASSISTANT: Lars Pinks PA-C  ANESTHESIA:   general  EBL:  100 mL   BLOOD ADMINISTERED:none  DRAINS: 89 French chest tube placed in the right pleural space   LOCAL MEDICATIONS USED:  Exparel  SPECIMEN:  Source of Specimen:  RUL wedge, lymph nodes  DISPOSITION OF SPECIMEN:  PATHOLOGY Frozen section showed NO MALIGNANCY  COUNTS CORRECT:  YES  DICTATION: -  PLAN OF CARE: Admit to inpatient   PATIENT DISPOSITION:  PACU - hemodynamically stable.   Delay start of Pharmacological VTE agent (>24hrs) due to surgical blood loss or risk of bleeding: yes

## 2018-06-03 NOTE — Interval H&P Note (Signed)
History and Physical Interval Note:  06/03/2018 7:20 AM  Joseph Decker  has presented today for surgery, with the diagnosis of RUL NODULE  The various methods of treatment have been discussed with the patient and family. After consideration of risks, benefits and other options for treatment, the patient has consented to  Procedure(s): VIDEO ASSISTED THORACOSCOPY (VATS)/WEDGE RESECTION (Right) possible right upper LOBECTOMY (Right) as a surgical intervention .  The patient's history has been reviewed, patient examined, no change in status, stable for surgery.  I have reviewed the patient's chart and labs.  Questions were answered to the patient's satisfaction.     Melrose Nakayama

## 2018-06-03 NOTE — Anesthesia Procedure Notes (Signed)
Procedure Name: Intubation Date/Time: 06/03/2018 7:45 AM Performed by: Valda Favia, CRNA Pre-anesthesia Checklist: Patient identified, Emergency Drugs available, Suction available, Patient being monitored and Timeout performed Patient Re-evaluated:Patient Re-evaluated prior to induction Oxygen Delivery Method: Circle system utilized Preoxygenation: Pre-oxygenation with 100% oxygen Induction Type: IV induction Ventilation: Mask ventilation without difficulty and Oral airway inserted - appropriate to patient size Laryngoscope Size: Mac and 4 Grade View: Grade I Endobronchial tube: Double lumen EBT, EBT position confirmed by auscultation and EBT position confirmed by fiberoptic bronchoscope and 39 Fr Number of attempts: 1 Airway Equipment and Method: Stylet Placement Confirmation: ETT inserted through vocal cords under direct vision,  positive ETCO2 and breath sounds checked- equal and bilateral Tube secured with: Tape Dental Injury: Teeth and Oropharynx as per pre-operative assessment

## 2018-06-03 NOTE — Transfer of Care (Signed)
Immediate Anesthesia Transfer of Care Note  Patient: Joseph Decker  Procedure(s) Performed: VIDEO ASSISTED THORACOSCOPY (VATS)/WEDGE RESECTION for nodule (Right Chest)  Patient Location: PACU  Anesthesia Type:General  Level of Consciousness: awake and alert   Airway & Oxygen Therapy: Patient Spontanous Breathing and Patient connected to nasal cannula oxygen  Post-op Assessment: Report given to RN and Post -op Vital signs reviewed and stable  Post vital signs: Reviewed and stable  Last Vitals:  Vitals Value Taken Time  BP 139/82 06/03/2018 10:08 AM  Temp    Pulse 63 06/03/2018 10:18 AM  Resp 17 06/03/2018 10:18 AM  SpO2 100 % 06/03/2018 10:18 AM  Vitals shown include unvalidated device data.  Last Pain:  Vitals:   06/03/18 0543  TempSrc: Oral         Complications: No apparent anesthesia complications

## 2018-06-03 NOTE — Anesthesia Procedure Notes (Signed)
Arterial Line Insertion Performed by: Valda Favia, CRNA, CRNA  Preanesthetic checklist: patient identified, IV checked, site marked, risks and benefits discussed, surgical consent, monitors and equipment checked, pre-op evaluation, timeout performed and anesthesia consent Lidocaine 1% used for infiltration and patient sedated Left, radial was placed Catheter size: 20 G Hand hygiene performed , maximum sterile barriers used  and Seldinger technique used Allen's test indicative of satisfactory collateral circulation Attempts: 2 Procedure performed without using ultrasound guided technique. Following insertion, Biopatch and dressing applied. Post procedure assessment: normal  Patient tolerated the procedure well with no immediate complications.

## 2018-06-03 NOTE — Anesthesia Preprocedure Evaluation (Signed)
Anesthesia Evaluation  Patient identified by MRN, date of birth, ID band Patient awake    Reviewed: Allergy & Precautions, NPO status , Patient's Chart, lab work & pertinent test results  Airway Mallampati: II  TM Distance: >3 FB Neck ROM: Full    Dental  (+) Teeth Intact, Dental Advisory Given   Pulmonary former smoker,    breath sounds clear to auscultation       Cardiovascular hypertension,  Rhythm:Regular Rate:Normal     Neuro/Psych    GI/Hepatic   Endo/Other    Renal/GU      Musculoskeletal   Abdominal   Peds  Hematology   Anesthesia Other Findings   Reproductive/Obstetrics                             Anesthesia Physical Anesthesia Plan  ASA: III  Anesthesia Plan: General   Post-op Pain Management:    Induction: Intravenous  PONV Risk Score and Plan: Ondansetron and Dexamethasone  Airway Management Planned: Double Lumen EBT  Additional Equipment:   Intra-op Plan:   Post-operative Plan: Extubation in OR  Informed Consent: I have reviewed the patients History and Physical, chart, labs and discussed the procedure including the risks, benefits and alternatives for the proposed anesthesia with the patient or authorized representative who has indicated his/her understanding and acceptance.   Dental advisory given  Plan Discussed with: CRNA and Anesthesiologist  Anesthesia Plan Comments:         Anesthesia Quick Evaluation

## 2018-06-03 NOTE — Anesthesia Postprocedure Evaluation (Signed)
Anesthesia Post Note  Patient: Joseph Decker  Procedure(s) Performed: VIDEO ASSISTED THORACOSCOPY (VATS)/WEDGE RESECTION for nodule (Right Chest)     Patient location during evaluation: PACU Anesthesia Type: General Level of consciousness: awake and alert Pain management: pain level controlled Vital Signs Assessment: post-procedure vital signs reviewed and stable Respiratory status: spontaneous breathing, nonlabored ventilation, respiratory function stable and patient connected to nasal cannula oxygen Cardiovascular status: blood pressure returned to baseline and stable Postop Assessment: no apparent nausea or vomiting Anesthetic complications: no    Last Vitals:  Vitals:   06/03/18 1522 06/03/18 1928  BP: 123/69   Pulse: (!) 59   Resp: 16 16  Temp: 36.5 C   SpO2: 97% 95%    Last Pain:  Vitals:   06/03/18 1928  TempSrc:   PainSc: 2                  Myiah Petkus COKER

## 2018-06-03 NOTE — Plan of Care (Signed)
New admits VATS with wedge resection, initiated care plan for VATS

## 2018-06-03 NOTE — Anesthesia Procedure Notes (Signed)
Central Venous Catheter Insertion Performed by: Roberts Gaudy, MD, anesthesiologist Start/End6/09/2018 6:35 AM, 06/03/2018 6:45 AM Patient location: Pre-op. Preanesthetic checklist: patient identified, IV checked, site marked, risks and benefits discussed, surgical consent, monitors and equipment checked, pre-op evaluation, timeout performed and anesthesia consent Position: Trendelenburg Lidocaine 1% used for infiltration and patient sedated Hand hygiene performed , maximum sterile barriers used  and Seldinger technique used Catheter size: 8 Fr Total catheter length 16. Central line was placed.Double lumen Procedure performed using ultrasound guided technique. Ultrasound Notes:anatomy identified, needle tip was noted to be adjacent to the nerve/plexus identified, no ultrasound evidence of intravascular and/or intraneural injection and image(s) printed for medical record Attempts: 1 Following insertion, dressing applied, line sutured and Biopatch. Post procedure assessment: blood return through all ports  Patient tolerated the procedure well with no immediate complications.

## 2018-06-04 ENCOUNTER — Encounter (HOSPITAL_COMMUNITY): Payer: Self-pay | Admitting: Thoracic Surgery (Cardiothoracic Vascular Surgery)

## 2018-06-04 ENCOUNTER — Inpatient Hospital Stay (HOSPITAL_COMMUNITY): Payer: Medicare Other

## 2018-06-04 LAB — CBC
HEMATOCRIT: 37.1 % — AB (ref 39.0–52.0)
Hemoglobin: 12.4 g/dL — ABNORMAL LOW (ref 13.0–17.0)
MCH: 33.9 pg (ref 26.0–34.0)
MCHC: 33.4 g/dL (ref 30.0–36.0)
MCV: 101.4 fL — AB (ref 78.0–100.0)
PLATELETS: 159 10*3/uL (ref 150–400)
RBC: 3.66 MIL/uL — AB (ref 4.22–5.81)
RDW: 12.4 % (ref 11.5–15.5)
WBC: 13.1 10*3/uL — AB (ref 4.0–10.5)

## 2018-06-04 LAB — BASIC METABOLIC PANEL
Anion gap: 4 — ABNORMAL LOW (ref 5–15)
BUN: 16 mg/dL (ref 6–20)
CHLORIDE: 111 mmol/L (ref 101–111)
CO2: 25 mmol/L (ref 22–32)
CREATININE: 1.02 mg/dL (ref 0.61–1.24)
Calcium: 8.4 mg/dL — ABNORMAL LOW (ref 8.9–10.3)
Glucose, Bld: 222 mg/dL — ABNORMAL HIGH (ref 65–99)
Potassium: 4.4 mmol/L (ref 3.5–5.1)
SODIUM: 140 mmol/L (ref 135–145)

## 2018-06-04 LAB — GLUCOSE, CAPILLARY
GLUCOSE-CAPILLARY: 245 mg/dL — AB (ref 65–99)
GLUCOSE-CAPILLARY: 120 mg/dL — AB (ref 65–99)
GLUCOSE-CAPILLARY: 189 mg/dL — AB (ref 65–99)
Glucose-Capillary: 204 mg/dL — ABNORMAL HIGH (ref 65–99)
Glucose-Capillary: 146 mg/dL — ABNORMAL HIGH (ref 65–99)

## 2018-06-04 LAB — BLOOD GAS, ARTERIAL
ACID-BASE DEFICIT: 3.7 mmol/L — AB (ref 0.0–2.0)
BICARBONATE: 20.3 mmol/L (ref 20.0–28.0)
DRAWN BY: 249101
O2 Content: 2 L/min
O2 Saturation: 95.6 %
PATIENT TEMPERATURE: 98.6
PH ART: 7.399 (ref 7.350–7.450)
pCO2 arterial: 33.6 mmHg (ref 32.0–48.0)
pO2, Arterial: 76.9 mmHg — ABNORMAL LOW (ref 83.0–108.0)

## 2018-06-04 MED ORDER — INSULIN ASPART 100 UNIT/ML ~~LOC~~ SOLN
0.0000 [IU] | Freq: Three times a day (TID) | SUBCUTANEOUS | Status: DC
Start: 2018-06-04 — End: 2018-06-05
  Administered 2018-06-04: 2 [IU] via SUBCUTANEOUS

## 2018-06-04 MED ORDER — INSULIN ASPART 100 UNIT/ML ~~LOC~~ SOLN: 0.0000 [IU] | Freq: Every day | SUBCUTANEOUS | Status: DC

## 2018-06-04 MED ORDER — DOCUSATE SODIUM 100 MG PO CAPS
100.0000 mg | ORAL_CAPSULE | Freq: Two times a day (BID) | ORAL | Status: DC | PRN
  Administered 2018-06-04: 100 mg via ORAL
  Filled 2018-06-04: qty 1

## 2018-06-04 MED ORDER — SODIUM CHLORIDE 0.9 % IV SOLN
INTRAVENOUS | Status: DC
  Administered 2018-06-04: 08:00:00 via INTRAVENOUS

## 2018-06-04 NOTE — Progress Notes (Addendum)
      [address]       [place] [address]             [phone]       1 Day Post-Op Procedure(s) (LRB): VIDEO ASSISTED THORACOSCOPY (VATS)/WEDGE RESECTION for nodule (Right)  Subjective: Patient did not sleep much as kept "sinking down into bed".  Objective: Vital signs in last 24 hours: Temp:  [97.2 F (36.2 C)-98.4 F (36.9 C)] 98 F (36.7 C) (06/11 0539) Pulse Rate:  [59-66] 65 (06/10 2349) Cardiac Rhythm: Normal sinus rhythm (06/11 0400) Resp:  [12-25] 18 (06/11 0324) BP: (108-150)/(61-93) 114/76 (06/11 0539) SpO2:  [93 %-100 %] 95 % (06/11 0324) Arterial Line BP: (110-167)/(60-81) 110/64 (06/10 2343) Weight:  [234 lb 9.1 oz (106.4 kg)-236 lb 1.6 oz (107.1 kg)] 236 lb 1.6 oz (107.1 kg) (06/11 0539)     Intake/Output from previous day: 06/10 0701 - 06/11 0700 In: 3641.7 [P.O.:720; I.V.:2821.7; IV Piggyback:100] Out: 9147 [Urine:4225; Blood:100; Chest Tube:203]   Physical Exam:  Cardiovascular: RRR Pulmonary: Clear to auscultation on left and slightly coarse on the right. Abdomen: Soft, non tender, bowel sounds present. Extremities: No lower extremity edema. Wounds: Clean and dry.  No erythema or signs of infection. Chest Tube: to suction, no air leak  Lab Results: CBC: Recent Labs    06/04/18 0420  WBC 13.1*  HGB 12.4*  HCT 37.1*  PLT 159   BMET:  Recent Labs    06/04/18 0420  NA 140  K 4.4  CL 111  CO2 25  GLUCOSE 222*  BUN 16  CREATININE 1.02  CALCIUM 8.4*    PT/INR: No results for input(s): LABPROT, INR in the last 72 hours. ABG:  INR: Will add last result for INR, ABG once components are confirmed Will add last 4 CBG results once components are confirmed  Assessment/Plan:  1. CV - SR in the 60's this am. Avapro (substitute for telmisartan) ordered for this am. 2.  Pulmonary -Chest tube with 203 cc since surgery. Chest tube is to suction and there is no air leak. On 2 liters of oxygen via [place]. CXR this am shows  patient is rotated to the left, trace right apical pnemuothorax. Hope to place chest tube to water seal. Encourage incentive spirometer. 3. CBGs 271/216/245. Patient states has pre diabetes. Will change IVF (remvoe dextrose) and later stop fluids. 4. Remove a line, foley  Donielle M ZimmermanPA-C 06/04/2018,7:40 AM   Patient seen and examined, agree with above CT to water seal  [place] C. Roxan Hockey, MD [facility] [phone]

## 2018-06-04 NOTE — Progress Notes (Signed)
Inpatient Diabetes Program Recommendations  AACE/ADA: New Consensus Statement on Inpatient Glycemic Control (2015)  Target Ranges:  Prepandial:   less than 140 mg/dL      Peak postprandial:   less than 180 mg/dL (1-2 hours)      Critically ill patients:  140 - 180 mg/dL   Lab Results  Component Value Date   GLUCAP 204 (H) 06/04/2018    Review of Glycemic Control Results for FRANTZ, QUATTRONE (MRN [id number]) as of 06/04/2018 10:19  Ref. Range 06/03/2018 17:36 06/03/2018 23:29 06/04/2018 05:37 06/04/2018 08:07  Glucose-Capillary Latest Ref Range: 65 - 99 mg/dL 271 (H) 216 (H) 245 (H) 204 (H)   Diabetes history: Prediabetes per MD note Outpatient Diabetes medications: none Current orders for Inpatient glycemic control: Novolog 0-15 units TID, Novolog 0-5 units QHS  Inpatient Diabetes Program Recommendations:     Patient received Decadron 5 mg x1 on 6/10, and per MD note patient is prediabetic, thus, BS may be increased today. If FSBS continues to exceed 180 mg/dL, consider adding Levemir 10 units QHS.   Consider switching regular diet to carb modified diet. Additionally, no recent A1C on file. Consider repeating A1C?    Thanks, Bronson Curb, MSN, RNC-OB Diabetes Coordinator [phone] (8a-5p)

## 2018-06-04 NOTE — Plan of Care (Signed)
Continue current care plan 

## 2018-06-04 NOTE — Consult Note (Signed)
[facility] CM Primary Care Navigator  06/04/2018  EMRYS Decker [birth year] [id number]   Met withpatient, partner (Babbie)and daughter Hildred Alamin) at the bedside toidentify possible discharge needs. Patientreportshaving a screening CT for lung cancer that showed nodule to right upper lobe thathad ledto thisadmission/ surgery.   PatientendorsesDr.Scott Holwerda with [place] care provider.   [facility] [place] [facility] [place] [facility] without difficulty.  Patientreports thathehas beenmanaginghismedications at home, straight out of the containers.  Patient verbalizedthat he was driving prior to admission but his partner or daughter willbe providing transportationto his doctors' appointments if needed after discharge.  Patient states that his partner will be the primary caregiver at home.  Anticipated plan for discharge is home according to patient.  Patientand familyvoiced understandingto callprimarycareprovider'soffice whenhereturns backhome,for a post discharge follow-upvisitwithin1- 2 weeksor sooner if needs arise.Patient letter (with PCP's contact number) was provided asareminder.   Discussed with patientand familyregarding [facility] CM services available for health management and resourcesat homebut denied any needs at this point.  Patientand familyverbalizedunderstandingto seekreferral from primary care provider to [facility] care management ifdeemed necessary and appropriatefor anyservicesin thefuture.  [facility] care management information was provided for futureneeds that patientmay have.  Patient however, hadverbally agreedand optedforEMMIcalls tofollow-upwith recoveryat home.   Referral made for [facility] General calls after discharge.   For additional questions please contact:  Edwena Felty A. Vasili Fok, BSN, RN-BC [place] PRIMARY  CARE Navigator Cell: [phone]

## 2018-06-04 NOTE — Op Note (Signed)
NAME: Joseph Decker, Joseph Decker MEDICAL RECORD YD:[id number] ACCOUNT [id number] DATE OF BIRTH:Feb 02, 1946 FACILITY: [facility] LOCATION: [facility] PHYSICIAN:Elayah Klooster Chaya Jan, MD  OPERATIVE REPORT  DATE OF PROCEDURE:  06/03/2018  PREOPERATIVE DIAGNOSIS:  Right upper lobe lung nodule.  POSTOPERATIVE DIAGNOSIS:  Granuloma, right upper lobe.  PROCEDURE:   Right video-assisted thoracoscopy, Wedge resection right upper lobe nodule, Intercostal nerve block.  SURGEON:  Modesto Charon, MD  ASSISTANT:  Lars Pinks, [facility]  ANESTHESIA:  General.  FINDINGS:  Adhesions to the chest wall in the vicinity of the nodule.  Extensive blebs in the vicinity of the nodule, wedge resection performed of 2 nodules. Both sites were marked, one showed only fibrosis, the other showed necrotizing granulomatous disease.  No  malignancy was seen at either site.  CLINICAL NOTE:  The patient is a 72 year old gentleman with a history of tobacco abuse who underwent low-dose CT screening for lung cancer.  A scan last year was negative, but this year a 13 mm nodule in the right upper lobe was noted on PET.  The nodule  was hypermetabolic.  It was recommended to the patient that we proceed with surgical resection for definitive diagnosis and plan to proceed with lobectomy if the nodule was cancerous.  The indications, risks, benefits, and alternatives were discussed in  detail with the patient.  He understood and accepted the risks and agreed to proceed.  DESCRIPTION OF PROCEDURE:  The patient was brought to the preoperative holding area on 06/03/2018.  Dr. Roberts Gaudy of the anesthesia service placed a central venous line and an arterial blood pressure monitoring line.  He was taken to the operating  room, anesthetized and intubated with a double lumen endotracheal tube.  A Foley catheter was placed.  Sequential compression devices were placed on the calves for DVT prophylaxis.  He was placed in a left lateral decubitus  position and the right chest  was prepped and draped in the usual sterile fashion.  Single lung ventilation of the left lung was initiated and was tolerated well throughout the procedure.  After performing a time out, the area for insertion of the scope was injected with a liposomal bupivacaine solution containing 20 mL of bupivacaine and 50 mL of saline. 5 ml was used at the site.  An incision then was made and a 5 mm port was inserted into the chest.  The  thoracoscope was advanced into the chest.  There was good isolation of the right lung.  There was no pleural effusion.  There were adhesions of the upper lobe to the superolateral pleura.  The area around the fourth interspace then was infiltrated with the  liposomal bupivacaine solution and a 5 cm working incision was made in that area.  No rib spreading was performed during the procedure.  The adhesions were taken down with the Harmonic scalpel.  The nodule was palpable in the area of the adhesions.   There were extensive blebs throughout the upper portion of the right upper lobe.  A wedge resection was performed encompassing the nodule and the majority of the emphysematous lung.  This was done with sequential firings of a 60 mm Echelon stapler using  gold cartridges.  The specimen was placed into an endoscopic retrieval bag and removed.  In addition to the primary nodule, there also was a separate nodule that was palpable.  Both of these were marked and sent for frozen section.  While awaiting those  results, the liposomal bupivacaine solution was used to perform intercostal nerve blocks.  The solution was injected into each interspace from the third to the ninth.  It was injected into a subpleural plane, 5 mL was used in each interspace.  The  inferior ligament was divided with the Harmonic scalpel.  The pleural reflection was divided posteriorly.  A relatively large level 7 node was identified.  This was dissected out and removed and sent for  permanent pathology.  Anteriorly, the pleural  reflection was incised.  There was a relatively large 10R node that was removed and sent for permanent pathology as well.  By this point, the frozen section returned showing a necrotizing granuloma at the primary site and a fibrotic tissue at the  secondary nodule.  There was no malignancy seen at either site.  Final inspection was made of the staple lines.  There was good hemostasis.  A 28 French chest tube was placed through the original port incision and secured with a #1 silk suture.  The  right lung was reinflated.  The working incision was closed in standard fashion in 3 layers, the chest tube was placed to suction.  The patient was placed back in the supine position.  He was extubated in the operating room and taken to the  [facility] in good condition.  TN/NUANCE  D:06/03/2018 T:06/04/2018 JOB:[id number]

## 2018-06-04 NOTE — Discharge Instructions (Signed)
Thoracoscopy, Care After °Refer to this sheet in the next few weeks. These instructions provide you with information about caring for yourself after your procedure. Your health care provider may also give you more specific instructions. Your treatment has been planned according to current medical practices, but problems sometimes occur. Call your health care provider if you have any problems or questions after your procedure. °What can I expect after the procedure? °After your procedure, it is common to feel sore for up to two weeks. °Follow these instructions at home: °· There are many different ways to close and cover an incision, including stitches (sutures), skin glue, and adhesive strips. Follow your health care provider's instructions about: °? Incision care. °? Bandage (dressing) changes and removal. °? Incision closure removal. °· Check your incision area every day for signs of infection. Watch for: °? Redness, swelling, or pain. °? Fluid, blood, or pus. °· Take medicines only as directed by your health care provider. °· Try to cough often. Coughing helps to protect against lung infection (pneumonia). It may hurt to cough. If this happens, hold a pillow against your chest when you cough. °· Take deep breaths. This also helps to protect against pneumonia. °· If you were given an incentive spirometer, use it as directed by your health care provider. °· Do not take baths, swim, or use a hot tub until your health care provider approves. You may take showers. °· Avoid lifting until your health care provider approves. °· Avoid driving until your health care provider approves. °· Do not travel by airplane after the chest tube is removed until your health care provider approves. °Contact a health care provider if: °· You have a fever. °· Pain medicines do not ease your pain. °· You have redness, swelling, or increasing pain in your incision area. °· You develop a cough that does not go away, or you are coughing up  mucus that is yellow or green. °Get help right away if: °· You have fluid, blood, or pus coming from your incision. °· There is a bad smell coming from your incision or dressing. °· You develop a rash. °· You have difficulty breathing. °· You cough up blood. °· You develop light-headedness or you feel faint. °· You develop chest pain. °· Your heartbeat feels irregular or very fast. °This information is not intended to replace advice given to you by your health care provider. Make sure you discuss any questions you have with your health care provider. °Document Released: 06/30/2005 Document Revised: 08/13/2016 Document Reviewed: 08/26/2014 °Elsevier Interactive Patient Education © 2018 [employer] Inc. ° °

## 2018-06-04 NOTE — Discharge Summary (Addendum)
Physician Discharge Summary       [address]       [place] [address]             [phone]    Patient ID: Joseph Decker MRN: [id number] DOB/AGE: 1946-08-18 72 y.o.  Admit date: 06/03/2018 Discharge date: 06/06/2018  Admission Diagnoses: Nodule of right lung  Discharge Diagnoses:  1. Nodule is non malignant;Necrtoizing granuloma 2. History of Arthritis 3. History of CAD (coronary artery disease)  4. History of GERD (gastroesophageal reflux disease) 5. History of enlarged prostate 6. History of DDD (degenerative disc disease), lumbar 7. History of macrocytosis without anemia 8. History of hyperglycemia 9. History of peripheral neuropathy 10. History of psoriasis 11. History of PTSD (post-traumatic stress disorder)  Procedure (s):  Right video-assisted thoracoscopy, wedge resection, right upper lobe nodule intercostal nerve block by Dr. Roxan Hockey on 06/02/2018.  Pathology:  1. Lung, wedge biopsy/resection, Right Upper Lobe - TWO FIBROELASTOTIC SCAR-LIKE NODULES, SUGGESTIVE OF PULMONARY APICAL CAP - ONE OF THE NODULES ALSO SHOWS A NECROTIZING GRANULOMA. SEE NOTE. - UNINVOLVED LUNG PARENCHYMA WITH NO SPECIFIC HISTOPATHOLOGIC CHANGES. - NEGATIVE FOR MALIGNANCY. 2. Lymph node, biopsy, Level 7 - LYMPH NODE, NEGATIVE FOR MALIGNANCY. 3. Lymph node, biopsy, Level 10 - LYMPH NODE, NEGATIVE FOR MALIGNANCY.  History of Presenting Illness: Joseph Decker is a 72 year old man with a history of tobacco abuse entheses 80 pack years), PTSD, anxiety, psoriasis, degenerative joint disease with previous bilateral knee replacements, lumbar disc disease, and peripheral neuropathy.  He started the low-dose screening CT program for lung cancer last year.  His initial scan did not show any suspicious nodules.  His scan this year showed a new 13 mm nodule in the right upper lobe.  This was a spiculated nodule peripherally in an area with significant emphysema.  He has problems  with bilateral knee pain as well as lumbar spine pain and peripheral neuropathy.  Despite that he is able to exercise on a regular basis.  He rides his bike several miles a day.  He has not had any changes in appetite or weight loss.  He is very anxious about the results of his studies. Dr. Roxan Hockey and the patient discussed treatment options including surgical resection and radiation.  He understands the chances of survival are better with a surgical resection and staging is more accurate as well.  Dr. Roxan Hockey and the patient discussed issues related to biopsies.  His case would be difficult to biopsy this lesion bronchoscopically because of his peripheral nature.  A negative biopsy would not rule out cancer.  CT-guided biopsy would be dangerous due to the surrounding emphysematous changes.  Dr. Roxan Hockey described the general nature of the operation to him.  He understands the need for general anesthesia, the incisions to be used, the use of a drainage tube postoperatively, and the expected hospital stay and overall recovery.  Dr. Roxan Hockey informed him of the indications, risks, benefits, and alternatives.  He understands the risks and agrees to proceed with surgery. He was admitted on 06/02/2018 in order to undergo a right VATS, wedge resection RUL, and intercostal nerve block.   Brief Hospital Course: The patient remained afebrile and hemodynamically stable. A line and foley were removed early in the post operative course. Patient's glucose elevated after surgery. He has pre diabetes but elevation secondary to dextrose in IVF. Once dextrose removed, glucose much improved. Chest tube output gradually decreased and there was no air leak. Daily chest x rays were obtained and  remained stable. Chest tube was placed to water seal on 06/11. Chest tube was then removed on 06/05/2018. Patient is ambulating on room air. Patient is tolerating a diet and has had a bowel movement. Wounds are clean and  dry. Final chest X ray showed no pneumothorax, small right pleural effusion, and bilateral airspace disease . Per patient request, a prescription for Hydrocodone was given for pain. Patient instructed he may take Aleve for mild pain. Patient is felt surgically stable for discharge today.   Latest Vital Signs: Blood pressure 112/72, pulse 66, temperature 98.5 F (36.9 C), temperature source Oral, resp. rate 14, height 6\' 2"  (1.88 m), weight 236 lb 1.6 oz (107.1 kg), SpO2 96 %.  Physical Exam: Cardiovascular: RRR Pulmonary: Mostly clear Abdomen: Soft, non tender, bowel sounds present. Extremities: No lower extremity edema. Wounds: Clean and dry.  No erythema or signs    Discharge Condition: Stable and discharged to home.  Recent laboratory studies:  Lab Results  Component Value Date   WBC 11.2 (H) 06/05/2018   HGB 13.2 06/05/2018   HCT 39.3 06/05/2018   MCV 102.3 (H) 06/05/2018   PLT 155 06/05/2018   Lab Results  Component Value Date   NA 141 06/05/2018   K 4.3 06/05/2018   CL 110 06/05/2018   CO2 26 06/05/2018   CREATININE 1.10 06/05/2018   GLUCOSE 136 (H) 06/05/2018    Diagnostic Studies:  CLINICAL DATA:  Pneumothorax  EXAM: CHEST - 2 VIEW  COMPARISON:  06/05/2018  FINDINGS: Previously seen small right apical pneumothorax no longer visualized. Patchy right lung airspace disease and opacities at the left base are stable. Small right pleural effusion. Heart is normal size.  IMPRESSION: No visible pneumothorax currently.  Stable patchy bilateral airspace disease, right greater than left.   Electronically Signed   By: Rolm Baptise M.D.   On: 06/06/2018 07:33   Discharge Medications: Allergies as of 06/06/2018      Reactions   Antihistamines, Chlorpheniramine-type Other (See Comments)   Feels drunk   Oxycodone Other (See Comments)   Gout symptoms, swollen and painful joints      Medication List    STOP taking these medications   acetaminophen  500 MG tablet Commonly known as:  TYLENOL     TAKE these medications   ALPRAZolam 0.5 MG tablet Commonly known as:  XANAX Take 0.5 mg by mouth daily as needed for anxiety.   Biotin 5000 MCG Tabs Take 5,000 mcg by mouth daily.   calcipotriene 0.005 % cream Commonly known as:  DOVONOX Apply 1 application topically 2 (two) times daily as needed (for elbows).   CALCIUM 600+D3 PO Take 1 tablet by mouth daily.   clobetasol 0.05 % topical foam Commonly known as:  OLUX Apply 1 application topically 2 (two) times daily as needed (for itching scalp).   EQL OMEGA 3 FISH OIL 1400 MG Caps Take 1,400 mg by mouth daily.   HYDROcodone-acetaminophen 5-325 MG tablet Commonly known as:  NORCO/VICODIN Take 1 tablet by mouth every 6 (six) hours as needed for moderate pain.   ibuprofen 200 MG tablet Commonly known as:  ADVIL,MOTRIN Take 400 mg by mouth every 8 (eight) hours as needed (for pain.).   methocarbamol 500 MG tablet Commonly known as:  ROBAXIN Take 500 mg by mouth every 6 (six) hours as needed for muscle spasms.   multivitamin with minerals Tabs tablet Take 1 tablet by mouth daily. 2-3 times a week   NAPROXEN DR 500 MG EC tablet  Generic drug:  naproxen Take 500 mg by mouth daily as needed (for pain.).   OSTEO BI-FLEX REGULAR STRENGTH PO Take 1 tablet by mouth daily.   PRESERVISION AREDS 2 PO Take 1 tablet by mouth 2 (two) times daily.   rosuvastatin 10 MG tablet Commonly known as:  CRESTOR Take 10 mg by mouth See admin instructions. TAKE 1 TABLET (10 MG) BY MOUTH 5 TIMES A WEEK IN THE EVENING   SYSTANE 0.4-0.3 % Soln Generic drug:  Polyethyl Glycol-Propyl Glycol Place 1-2 drops into both eyes 3 (three) times daily as needed (for dry/irritated eyes.).   tamsulosin 0.4 MG Caps capsule Commonly known as:  FLOMAX Take 0.4 mg by mouth daily after supper. 30 minutes after supper   telmisartan 20 MG tablet Commonly known as:  MICARDIS Take 20 mg by mouth daily.     triamcinolone 0.025 % ointment Commonly known as:  KENALOG Apply 1 application topically 2 (two) times daily as needed (for psoriasis (skin breakdown)).   Vitamin D-3 5000 units Tabs Take 5,000 Units by mouth every other day.   vitamin E 400 UNIT capsule Generic drug:  vitamin E Take 400 Units by mouth daily.       Follow Up Appointments: Follow-up Information    Melrose Nakayama, MD. Go on 06/18/2018.   Specialty:  Cardiothoracic Surgery Why:  PA/LAT CXR to be taken (at [place] which is in the same building as Dr. Leonarda Salon office) on 06/18/2018 at 12:15 pm;Appointment time is at 12:45 pm Contact information: [place] [address] [phone]           Signed: Nani Skillern PA-C 06/06/2018, 1:30 PM

## 2018-06-05 ENCOUNTER — Inpatient Hospital Stay (HOSPITAL_COMMUNITY): Payer: Medicare Other

## 2018-06-05 LAB — COMPREHENSIVE METABOLIC PANEL
ALBUMIN: 3.4 g/dL — AB (ref 3.5–5.0)
ALK PHOS: 54 U/L (ref 38–126)
ALT: 25 U/L (ref 17–63)
AST: 26 U/L (ref 15–41)
Anion gap: 5 (ref 5–15)
BUN: 14 mg/dL (ref 6–20)
CALCIUM: 8.7 mg/dL — AB (ref 8.9–10.3)
CHLORIDE: 110 mmol/L (ref 101–111)
CO2: 26 mmol/L (ref 22–32)
CREATININE: 1.1 mg/dL (ref 0.61–1.24)
GFR calc non Af Amer: >60 mL/min (ref >60)
GLUCOSE: 136 mg/dL — AB (ref 65–99)
Potassium: 4.3 mmol/L (ref 3.5–5.1)
Sodium: 141 mmol/L (ref 135–145)
Total Bilirubin: 0.5 mg/dL (ref 0.3–1.2)
Total Protein: 6.3 g/dL — ABNORMAL LOW (ref 6.5–8.1)

## 2018-06-05 LAB — CBC
HCT: 39.3 % (ref 39.0–52.0)
HEMOGLOBIN: 13.2 g/dL (ref 13.0–17.0)
MCH: 34.4 pg — ABNORMAL HIGH (ref 26.0–34.0)
MCHC: 33.6 g/dL (ref 30.0–36.0)
MCV: 102.3 fL — ABNORMAL HIGH (ref 78.0–100.0)
PLATELETS: 155 10*3/uL (ref 150–400)
RBC: 3.84 MIL/uL — AB (ref 4.22–5.81)
RDW: 12.9 % (ref 11.5–15.5)
WBC: 11.2 10*3/uL — AB (ref 4.0–10.5)

## 2018-06-05 LAB — GLUCOSE, CAPILLARY
GLUCOSE-CAPILLARY: 134 mg/dL — AB (ref 65–99)
GLUCOSE-CAPILLARY: 174 mg/dL — AB (ref 65–99)
Glucose-Capillary: 126 mg/dL — ABNORMAL HIGH (ref 65–99)
Glucose-Capillary: 171 mg/dL — ABNORMAL HIGH (ref 65–99)

## 2018-06-05 MED ORDER — LACTULOSE 10 GM/15ML PO SOLN
20.0000 g | Freq: Once | ORAL | Status: AC
  Administered 2018-06-05: 20 g via ORAL

## 2018-06-05 MED ORDER — OXYCODONE-ACETAMINOPHEN 5-325 MG PO TABS: 1.0000 | ORAL_TABLET | ORAL | Status: DC | PRN

## 2018-06-05 MED ORDER — FENTANYL 40 MCG/ML IV SOLN
INTRAVENOUS | Status: AC
  Administered 2018-06-05: 0 ug via INTRAVENOUS

## 2018-06-05 MED ORDER — HYDROCODONE-ACETAMINOPHEN 5-325 MG PO TABS
1.0000 | ORAL_TABLET | Freq: Four times a day (QID) | ORAL | Status: DC | PRN
  Administered 2018-06-05: 1 via ORAL
  Administered 2018-06-05 – 2018-06-06 (×2): 2 via ORAL
  Filled 2018-06-05: qty 2
  Filled 2018-06-05: qty 1
  Filled 2018-06-05 (×2): qty 2

## 2018-06-05 NOTE — Care Management Note (Addendum)
Case Management Note  Patient Details  Name: Joseph Decker MRN: [id number] Date of Birth: 06/02/1946  Subjective/Objective:   Pt is s/p VATS                 Action/Plan:   PTA from home, uses cane for assistance with mobilization when needed.  Pt has PCP with [facility] and current prescription insurance.     Expected Discharge Date:                  Expected Discharge Plan:  Home/Self Care  In-House Referral:     Discharge planning Services  CM Consult  Post Acute Care Choice:    Choice offered to:     DME Arranged:    DME Agency:     HH Arranged:    HH Agency:     Status of Service:     If discussed at [employer] of Stay Meetings, dates discussed:    Additional Comments:  Maryclare Labrador, RN 06/05/2018, 1:42 PM

## 2018-06-05 NOTE — Progress Notes (Signed)
20 cc fentanyl wasted from pca with witness Carroll Kinds ,rn. Etta Quill

## 2018-06-05 NOTE — Progress Notes (Addendum)
      [address]       [place] [address]             [phone]       2 Days Post-Op Procedure(s) (LRB): VIDEO ASSISTED THORACOSCOPY (VATS)/WEDGE RESECTION for nodule (Right)  Subjective: Patient asking for laxative.  Objective: Vital signs in last 24 hours: Temp:  [97.6 F (36.4 C)-99.3 F (37.4 C)] 99.3 F (37.4 C) (06/12 0343) Pulse Rate:  [64-74] 74 (06/11 1945) Cardiac Rhythm: Normal sinus rhythm (06/11 2356) Resp:  [15-27] 15 (06/12 0412) BP: (101-135)/(21-77) 101/21 (06/12 0343) SpO2:  [91 %-100 %] 94 % (06/12 0412) Arterial Line BP: (124)/(86) 124/86 (06/11 0806)     Intake/Output from previous day: 06/11 0701 - 06/12 0700 In: 1331.3 [P.O.:1080; I.V.:251.3] Out: 1630 [Urine:1450; Chest Tube:180]   Physical Exam:  Cardiovascular: RRR Pulmonary: Mostly clear Abdomen: Soft, non tender, some distention, bowel sounds present. Extremities: No lower extremity edema. Wounds: Clean and dry.  No erythema or signs of infection. Chest Tube: to water seal, no air leak  Lab Results: CBC: Recent Labs    06/04/18 0420 06/05/18 0238  WBC 13.1* 11.2*  HGB 12.4* 13.2  HCT 37.1* 39.3  PLT 159 155   BMET:  Recent Labs    06/04/18 0420 06/05/18 0238  NA 140 141  K 4.4 4.3  CL 111 110  CO2 25 26  GLUCOSE 222* 136*  BUN 16 14  CREATININE 1.02 1.10  CALCIUM 8.4* 8.7*    PT/INR: No results for input(s): LABPROT, INR in the last 72 hours. ABG:  INR: Will add last result for INR, ABG once components are confirmed Will add last 4 CBG results once components are confirmed  Assessment/Plan:  1. CV - SR in the 60-70's this am. On Avapro (substitute for telmisartan). 2.  Pulmonary -Chest tube with 180 cc since surgery. Chest tube is to water seal and there is no air leak. On 2 liters of oxygen via [place]. CXR this am appears to show trace right apical pnemuothorax. Hope to remove chest tube. Check CXR in am. Encourage incentive  spirometer. Pathology: NO malignancy. 1 of 2 nodules showed necrotizing granuloma. Dr. Roxan Hockey will discuss with patient. 3. CBGs 146/120/189. Patient states has pre diabetes. Glucose improved with cessation of dextrose in IVF. Stop accu checks 4. LOC constipation 5. Stop PCA and discontinue central line after chest tube removed 6. If CXR stable, hope to discharge in am  Donielle M ZimmermanPA-C 06/05/2018,7:15 AM   Patient seen and examined, agree with above No air leak and minimal drainage- dc chest tube Possibly home tomorrow  Remo Lipps C. Roxan Hockey, MD [facility] [phone]

## 2018-06-06 ENCOUNTER — Inpatient Hospital Stay (HOSPITAL_COMMUNITY): Payer: Medicare Other

## 2018-06-06 LAB — GLUCOSE, CAPILLARY: GLUCOSE-CAPILLARY: 131 mg/dL — AB (ref 65–99)

## 2018-06-06 MED ORDER — HYDROCODONE-ACETAMINOPHEN 5-325 MG PO TABS: 1.0000 | ORAL_TABLET | Freq: Four times a day (QID) | ORAL | 0 refills | Status: DC | PRN

## 2018-06-06 NOTE — Progress Notes (Addendum)
      [address]       [place] [address]             [phone]       3 Days Post-Op Procedure(s) (LRB): VIDEO ASSISTED THORACOSCOPY (VATS)/WEDGE RESECTION for nodule (Right)  Subjective: Patient had 2 bowel movements. No complaints this am.  Objective: Vital signs in last 24 hours: Temp:  [98.2 F (36.8 C)-98.8 F (37.1 C)] 98.2 F (36.8 C) (06/13 0406) Pulse Rate:  [66-72] 70 (06/13 0406) Cardiac Rhythm: Normal sinus rhythm (06/13 0410) Resp:  [16-22] 17 (06/13 0406) BP: (109-142)/(64-85) 115/72 (06/13 0406) SpO2:  [91 %-100 %] 95 % (06/13 0406)     Intake/Output from previous day: 06/12 0701 - 06/13 0700 In: 1200 [P.O.:1200] Out: 1300 [Urine:1300]   Physical Exam:  Cardiovascular: RRR Pulmonary: Mostly clear Abdomen: Soft, non tender, bowel sounds present. Extremities: No lower extremity edema. Wounds: Clean and dry.  No erythema or signs of infection.   Lab Results: CBC: Recent Labs    06/04/18 0420 06/05/18 0238  WBC 13.1* 11.2*  HGB 12.4* 13.2  HCT 37.1* 39.3  PLT 159 155   BMET:  Recent Labs    06/04/18 0420 06/05/18 0238  NA 140 141  K 4.4 4.3  CL 111 110  CO2 25 26  GLUCOSE 222* 136*  BUN 16 14  CREATININE 1.02 1.10  CALCIUM 8.4* 8.7*    PT/INR: No results for input(s): LABPROT, INR in the last 72 hours. ABG:  INR: Will add last result for INR, ABG once components are confirmed Will add last 4 CBG results once components are confirmed  Assessment/Plan:  1. CV - SR in the 60-70's this am. On Avapro (substitute for telmisartan). 2.  Pulmonary -Chest tube removed yesterday. On room air. CXR this am appears to show no pneumothorax, small right pleural effusion, and stable patchy airspace disease. Check CXR in am. Encourage incentive spirometer. 3.  Discharge this afternoon   Donielle M ZimmermanPA-C 06/06/2018,7:37 AM   Patient seen and examined, agree with above  Remo Lipps C. Roxan Hockey, MD [facility] [phone]

## 2018-06-06 NOTE — Plan of Care (Signed)
Pt being discharged today to home with belongings with family

## 2018-06-06 NOTE — Progress Notes (Signed)
Explained and discussed discharge instructions, prescription and follow up visit given to patient. Patient going home with wife via w/c with belongings. No complaints of pain at this time

## 2018-06-06 NOTE — Care Management Important Message (Signed)
Important Message  Patient Details  Name: Joseph Decker MRN: [id number] Date of Birth: 1946-11-17   Medicare Important Message Given:  Yes    Azaleah Usman P [place] 06/06/2018, 1:44 PM

## 2018-06-17 ENCOUNTER — Other Ambulatory Visit: Payer: Self-pay | Admitting: Thoracic Surgery (Cardiothoracic Vascular Surgery)

## 2018-06-17 DIAGNOSIS — R911 Solitary pulmonary nodule: Secondary | ICD-10-CM

## 2018-06-18 ENCOUNTER — Ambulatory Visit (INDEPENDENT_AMBULATORY_CARE_PROVIDER_SITE_OTHER): Payer: Self-pay | Admitting: Thoracic Surgery (Cardiothoracic Vascular Surgery)

## 2018-06-18 ENCOUNTER — Encounter: Payer: Self-pay | Admitting: Thoracic Surgery (Cardiothoracic Vascular Surgery)

## 2018-06-18 ENCOUNTER — Other Ambulatory Visit: Payer: Self-pay

## 2018-06-18 ENCOUNTER — Ambulatory Visit
Admission: RE | Admit: 2018-06-18 | Discharge: 2018-06-18 | Disposition: A | Discharge status: Home / Self Care | Payer: Medicare Other | Source: Ambulatory Visit | Attending: Thoracic Surgery (Cardiothoracic Vascular Surgery) | Admitting: Thoracic Surgery (Cardiothoracic Vascular Surgery)

## 2018-06-18 VITALS — BP 116/80 | HR 78 | Resp 16 | Ht 73.75 in | Wt 224.0 lb

## 2018-06-18 DIAGNOSIS — Z09 Encounter for follow-up examination after completed treatment for conditions other than malignant neoplasm: Secondary | ICD-10-CM

## 2018-06-18 DIAGNOSIS — R911 Solitary pulmonary nodule: Secondary | ICD-10-CM

## 2018-06-18 DIAGNOSIS — J90 Pleural effusion, not elsewhere classified: Secondary | ICD-10-CM | POA: Diagnosis not present

## 2018-06-18 NOTE — Progress Notes (Signed)
[address]       [place] [address]             [phone]    HPI: Mr. Chery returns for scheduled postoperative follow-up visit  Veer Elamin is a 72 year old man with a history of tobacco abuse, anxiety, PTSD, psoriasis, arthritis with bilateral knee replacements, peripheral neuropathy, and lumbar disc disease.  He had a low-dose screening CT recently which showed a new 13 mm right upper lobe nodule in the area of some scarring.  On PET this was hypermetabolic.  I did a right VATS and wedge resection on 06/03/2018.  The nodule turned out to be a necrotizing granuloma.  There was other scarring in the area.  He did well postoperatively and went home on day 3.  He had some issues with his left knee initially after going home, but that has worked itself out.  He rode his E bike 20 miles yesterday and 2 separate sessions.  He has minimal discomfort in the left anterior chest.  He is only taking Tylenol.  He is not taking any narcotics.  Past Medical History:  Diagnosis Date  . Agent orange exposure   . Arthritis   . CAD (coronary artery disease)    coronary calcification on CT 04/10/17  . COPD (chronic obstructive pulmonary disease) (HCC)    emphysema  . DDD (degenerative disc disease), lumbar   . Ectatic abdominal aorta ([place])    04/10/17 U/S: 2.7 cm abdominal aorta, recommend rescreening in 5 years (Dr. Christen Butter)  . Enlarged prostate   . GERD (gastroesophageal reflux disease)   . Hyperglycemia   . Hyperlipidemia   . Hypertension   . Macrocytosis without anemia   . Narcolepsy    pt states he is self diagnosed  . Peripheral neuropathy   . Pneumonia    during his service in [place]  . Pre-diabetes   . Psoriasis   . PTSD (post-traumatic stress disorder)    [place] war veteran  . Sinus headache     Current Outpatient Medications  Medication Sig Dispense Refill  . ALPRAZolam (XANAX) 0.5 MG tablet Take 0.5 mg by mouth daily as needed for anxiety.    .  Biotin 5000 MCG TABS Take 5,000 mcg by mouth daily.    . calcipotriene (DOVONOX) 0.005 % cream Apply 1 application topically 2 (two) times daily as needed (for elbows).    . Calcium Carb-Cholecalciferol (CALCIUM 600+D3 PO) Take 1 tablet by mouth daily.    . Cholecalciferol (VITAMIN D-3) 5000 units TABS Take 5,000 Units by mouth every other day.    . clobetasol (OLUX) 0.05 % topical foam Apply 1 application topically 2 (two) times daily as needed (for itching scalp).    . Glucosamine-Chondroitin (OSTEO BI-FLEX REGULAR STRENGTH PO) Take 1 tablet by mouth daily.    Marland Kitchen ibuprofen (ADVIL,MOTRIN) 200 MG tablet Take 400 mg by mouth every 8 (eight) hours as needed (for pain.).    Marland Kitchen methocarbamol (ROBAXIN) 500 MG tablet Take 500 mg by mouth every 6 (six) hours as needed for muscle spasms.    . Multiple Vitamin (MULTIVITAMIN WITH MINERALS) TABS tablet Take 1 tablet by mouth daily. 2-3 times a week    . Multiple Vitamins-Minerals (PRESERVISION AREDS 2 PO) Take 1 tablet by mouth 2 (two) times daily.    . naproxen (NAPROXEN DR) 500 MG EC tablet Take 500 mg by mouth daily as needed (for pain.).    Marland Kitchen Omega-3 Fatty Acids (  EQL OMEGA 3 FISH OIL) 1400 MG CAPS Take 1,400 mg by mouth daily.    Vladimir Faster Glycol-Propyl Glycol (SYSTANE) 0.4-0.3 % SOLN Place 1-2 drops into both eyes 3 (three) times daily as needed (for dry/irritated eyes.).    Marland Kitchen rosuvastatin (CRESTOR) 10 MG tablet Take 10 mg by mouth See admin instructions. TAKE 1 TABLET (10 MG) BY MOUTH 5 TIMES A WEEK IN THE EVENING    . tamsulosin (FLOMAX) 0.4 MG CAPS capsule Take 0.4 mg by mouth daily after supper. 30 minutes after supper    . telmisartan (MICARDIS) 20 MG tablet Take 20 mg by mouth daily.    Marland Kitchen triamcinolone (KENALOG) 0.025 % ointment Apply 1 application topically 2 (two) times daily as needed (for psoriasis (skin breakdown)).    . vitamin E (VITAMIN E) 400 UNIT capsule Take 400 Units by mouth daily.    Marland Kitchen HYDROcodone-acetaminophen (NORCO/VICODIN) 5-325  MG tablet Take 1 tablet by mouth every 6 (six) hours as needed for moderate pain. (Patient not taking: Reported on 06/18/2018) 28 tablet 0   No current facility-administered medications for this visit.     Physical Exam BP 116/80 (BP Location: Right Arm, Patient Position: Sitting, Cuff Size: Large)   Pulse 78   Resp 16   Ht 6' 1.75" (1.873 m)   Wt 224 lb (101.6 kg)   SpO2 97% Comment: ON RA  BMI 28.84 kg/m  72 year old man in no acute distress Alert and oriented x3 with no focal deficits Lungs clear with equal breath sounds bilaterally Incisions healing well No peripheral edema  Diagnostic Tests: CHEST - 2 VIEW  COMPARISON:  06/06/2018 chest x-ray.  FINDINGS: Interval clearing of small right-sided pleural effusion and decrease in density of post surgical changes within the right upper lobe.  Curvilinear structure right lung apex possibly related to postsurgical changes rather than pneumothorax as lung markings are seen beyond this region.  No pulmonary edema or segmental consolidation.  Heart size within normal limits.  Calcified aorta.  IMPRESSION: Interval clearing of small right-sided pleural effusion and decrease in density of post surgical changes within the right upper lobe.  Curvilinear structure right lung apex possibly related to postsurgical changes rather than pneumothorax as lung markings are seen beyond this region.  Aortic Atherosclerosis (ICD10-I70.0).   Electronically Signed   By: Genia Del M.D.   On: 06/18/2018 12:28 I personally reviewed the chest x-ray images and concur with the findings noted above.  Impression: Mr. Reznik is a 72 year old man with a history of tobacco abuse who was found to have a 13 mm right upper lobe lung nodule.  The nodule was hypermetabolic on PET/CT.  I did a wedge resection on 06/03/2018.  It turned out to be a necrotizing granuloma.  AFB and fungal stains were negative.  Status post wedge  resection-he is doing remarkably well postop.  He is only 2 weeks out and is riding his bike.  He has minimal discomfort.  His exercise tolerance is excellent.  He is having minimal pain.  He takes Tylenol before he goes to bed at night, but is not having to use it during the day.  At this point his activities are unrestricted.  I emphasized the importance of continuing with the lung cancer screening  Plan: Return in 2 months with PA and lateral chest x-ray  Melrose Nakayama, MD [facility] [phone]

## 2018-07-03 ENCOUNTER — Telehealth: Payer: Self-pay

## 2018-07-03 NOTE — Telephone Encounter (Signed)
Patient called requesting a "letter of the operation" done by Dr. Roxan Hockey on 06/03/2018.  He had a VATS/ Wedge for a lung nodule.  Patient stated that he needed to give the letter to the [place] to see if this was related to issues stemming from him being in the [employer].  Copy of the operative note was printed for the patient to pick up at his earliest convenience.

## 2018-08-13 ENCOUNTER — Encounter: Payer: Medicare Other | Admitting: Thoracic Surgery (Cardiothoracic Vascular Surgery)

## 2018-08-13 DIAGNOSIS — L400 Psoriasis vulgaris: Secondary | ICD-10-CM | POA: Diagnosis not present

## 2018-09-02 ENCOUNTER — Other Ambulatory Visit: Payer: Self-pay | Admitting: Thoracic Surgery (Cardiothoracic Vascular Surgery)

## 2018-09-02 DIAGNOSIS — R911 Solitary pulmonary nodule: Secondary | ICD-10-CM

## 2018-09-03 ENCOUNTER — Ambulatory Visit
Admission: RE | Admit: 2018-09-03 | Discharge: 2018-09-03 | Disposition: A | Discharge status: Home / Self Care | Payer: Medicare Other | Source: Ambulatory Visit | Attending: Thoracic Surgery (Cardiothoracic Vascular Surgery) | Admitting: Thoracic Surgery (Cardiothoracic Vascular Surgery)

## 2018-09-03 ENCOUNTER — Ambulatory Visit (INDEPENDENT_AMBULATORY_CARE_PROVIDER_SITE_OTHER): Payer: Self-pay | Admitting: Thoracic Surgery (Cardiothoracic Vascular Surgery)

## 2018-09-03 ENCOUNTER — Encounter: Payer: Self-pay | Admitting: Thoracic Surgery (Cardiothoracic Vascular Surgery)

## 2018-09-03 VITALS — BP 98/73 | HR 95 | Resp 20 | Ht 73.75 in | Wt 237.0 lb

## 2018-09-03 DIAGNOSIS — R911 Solitary pulmonary nodule: Secondary | ICD-10-CM | POA: Diagnosis not present

## 2018-09-03 DIAGNOSIS — J439 Emphysema, unspecified: Secondary | ICD-10-CM | POA: Diagnosis not present

## 2018-09-03 NOTE — Progress Notes (Signed)
[address]       [place] [address]             [phone]     HPI: Joseph Decker returns for scheduled follow-up visit  Joseph Decker is a 72 year old man with a history of tobacco abuse (quit 10 years ago), anxiety, posttraumatic stress, bilateral total knee replacements for arthritis, psoriasis, peripheral neuropathy, and lumbar disc disease.  Back in the spring he had a low-dose CT scan as part of the lung cancer screening program.  He turned out to have a 13 mm right upper lobe nodule.  This nodule was hypermetabolic.  I did right VATS and wedge resection on 06/03/2018.  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  He did well postoperatively and went home day 3.  He has some pain initially but said that was gone within 3 weeks after the surgery.  He has been very faithful with exercising using an E bike and a kayak.  He thinks his respiratory function is better now than it was preoperatively.  Past Medical History:  Diagnosis Date  . Agent orange exposure   . Arthritis   . CAD (coronary artery disease)    coronary calcification on CT 04/10/17  . COPD (chronic obstructive pulmonary disease) (HCC)    emphysema  . DDD (degenerative disc disease), lumbar   . Ectatic abdominal aorta ([place])    04/10/17 U/S: 2.7 cm abdominal aorta, recommend rescreening in 5 years (Dr. Christen Butter)  . Enlarged prostate   . GERD (gastroesophageal reflux disease)   . Hyperglycemia   . Hyperlipidemia   . Hypertension   . Macrocytosis without anemia   . Narcolepsy    pt states he is self diagnosed  . Peripheral neuropathy   . Pneumonia    during his service in [place]  . Pre-diabetes   . Psoriasis   . PTSD (post-traumatic stress disorder)    [place] war veteran  . Sinus headache     Current Outpatient Medications  Medication Sig Dispense Refill  . ALPRAZolam (XANAX) 0.5 MG tablet Take 0.5 mg by mouth daily as needed for anxiety.    . Biotin 5000 MCG TABS Take 5,000 mcg by mouth daily.     . calcipotriene (DOVONOX) 0.005 % cream Apply 1 application topically 2 (two) times daily as needed (for elbows).    . Calcium Carb-Cholecalciferol (CALCIUM 600+D3 PO) Take 1 tablet by mouth daily.    . Cholecalciferol (VITAMIN D-3) 5000 units TABS Take 5,000 Units by mouth every other day.    . clobetasol (OLUX) 0.05 % topical foam Apply 1 application topically 2 (two) times daily as needed (for itching scalp).    . Glucosamine-Chondroitin (OSTEO BI-FLEX REGULAR STRENGTH PO) Take 1 tablet by mouth daily.    Marland Kitchen ibuprofen (ADVIL,MOTRIN) 200 MG tablet Take 400 mg by mouth every 8 (eight) hours as needed (for pain.).    Marland Kitchen methocarbamol (ROBAXIN) 500 MG tablet Take 500 mg by mouth every 6 (six) hours as needed for muscle spasms.    . Multiple Vitamin (MULTIVITAMIN WITH MINERALS) TABS tablet Take 1 tablet by mouth daily. 2-3 times a week    . Multiple Vitamins-Minerals (PRESERVISION AREDS 2 PO) Take 1 tablet by mouth 2 (two) times daily.    . naproxen (NAPROXEN DR) 500 MG EC tablet Take 500 mg by mouth daily as needed (for pain.).    Marland Kitchen Omega-3 Fatty Acids (EQL OMEGA 3 FISH OIL) 1400 MG CAPS Take 1,400  mg by mouth daily.    Vladimir Faster Glycol-Propyl Glycol (SYSTANE) 0.4-0.3 % SOLN Place 1-2 drops into both eyes 3 (three) times daily as needed (for dry/irritated eyes.).    Marland Kitchen rosuvastatin (CRESTOR) 10 MG tablet Take 10 mg by mouth See admin instructions. TAKE 1 TABLET (10 MG) BY MOUTH 5 TIMES A WEEK IN THE EVENING    . tamsulosin (FLOMAX) 0.4 MG CAPS capsule Take 0.4 mg by mouth daily after supper. 30 minutes after supper    . telmisartan (MICARDIS) 20 MG tablet Take 20 mg by mouth daily.    Marland Kitchen triamcinolone (KENALOG) 0.025 % ointment Apply 1 application topically 2 (two) times daily as needed (for psoriasis (skin breakdown)).    . vitamin E (VITAMIN E) 400 UNIT capsule Take 400 Units by mouth daily.    Marland Kitchen HYDROcodone-acetaminophen (NORCO/VICODIN) 5-325 MG tablet Take 1 tablet by mouth every 6 (six) hours  as needed for moderate pain. (Patient not taking: Reported on 09/03/2018) 28 tablet 0   No current facility-administered medications for this visit.     Physical Exam BP 98/73   Pulse 95   Resp 20   Ht 6' 1.75" (1.873 m)   Wt 237 lb (107.5 kg)   SpO2 98% Comment: RA  BMI 30.38 kg/m  72 year old man in no acute distress Alert and oriented x3 with no focal deficits Lungs clear with equal breath sounds bilaterally Incisions well-healed  Diagnostic Tests: CHEST - 2 VIEW  COMPARISON:  Radiographs of June 18, 2018. CT scan of April 03, 2018.  FINDINGS: The heart size and mediastinal contours are within normal limits. No pneumothorax or pleural effusion is noted. Emphysematous disease is noted in both upper lobes. Postsurgical changes are noted in the right upper lobe. Stable scarring is noted in right upper lobe. The visualized skeletal structures are unremarkable.  IMPRESSION: Grossly stable postsurgical changes and scarring seen in right upper lobe. No acute abnormality is noted.  Emphysema (ICD10-J43.9).   Electronically Signed   By: Marijo Conception, M.D.   On: 09/03/2018 14:09 I personally reviewed the chest x-ray images and concur with the findings noted above.  Impression: Joseph Decker is a 72 year old former smoker who had a lung nodule found on a low-dose screening CT back in May.  This was suspicious on PET but turned out to be a necrotizing granuloma at surgery.  He has recovered well from the surgery and is not having any residual pain.  His exercise tolerance is improved, mostly because he has become very serious about exercise since his health scare.  He does have a 40-pack-year history of smoking and quit 10 years ago so he needs to stay in the lung cancer screening program.  He should have another low-dose CT in about 9 months which would be a year from his previous scan.  He would like for me to follow that up so I will plan to see him back after that  scan.  Plan: Return in 9 months with a low-dose CT chest  Melrose Nakayama, MD [facility] [phone]

## 2018-09-10 DIAGNOSIS — H35363 Drusen (degenerative) of macula, bilateral: Secondary | ICD-10-CM | POA: Diagnosis not present

## 2018-09-30 DIAGNOSIS — M5126 Other intervertebral disc displacement, lumbar region: Secondary | ICD-10-CM | POA: Diagnosis not present

## 2018-09-30 DIAGNOSIS — M47816 Spondylosis without myelopathy or radiculopathy, lumbar region: Secondary | ICD-10-CM | POA: Diagnosis not present

## 2018-09-30 DIAGNOSIS — I10 Essential (primary) hypertension: Secondary | ICD-10-CM | POA: Diagnosis not present

## 2018-09-30 DIAGNOSIS — R918 Other nonspecific abnormal finding of lung field: Secondary | ICD-10-CM | POA: Diagnosis not present

## 2018-09-30 DIAGNOSIS — E1169 Type 2 diabetes mellitus with other specified complication: Secondary | ICD-10-CM | POA: Diagnosis not present

## 2018-09-30 DIAGNOSIS — Z6830 Body mass index (BMI) 30.0-30.9, adult: Secondary | ICD-10-CM | POA: Diagnosis not present

## 2018-09-30 DIAGNOSIS — M5416 Radiculopathy, lumbar region: Secondary | ICD-10-CM | POA: Diagnosis not present

## 2018-09-30 DIAGNOSIS — I2510 Atherosclerotic heart disease of native coronary artery without angina pectoris: Secondary | ICD-10-CM | POA: Diagnosis not present

## 2018-09-30 DIAGNOSIS — M5489 Other dorsalgia: Secondary | ICD-10-CM | POA: Diagnosis not present

## 2018-09-30 DIAGNOSIS — M545 Low back pain: Secondary | ICD-10-CM | POA: Diagnosis not present

## 2018-10-10 DIAGNOSIS — M25569 Pain in unspecified knee: Secondary | ICD-10-CM | POA: Diagnosis not present

## 2018-10-10 DIAGNOSIS — M545 Low back pain: Secondary | ICD-10-CM | POA: Diagnosis not present

## 2018-10-17 DIAGNOSIS — M545 Low back pain: Secondary | ICD-10-CM | POA: Diagnosis not present

## 2018-10-17 DIAGNOSIS — M25569 Pain in unspecified knee: Secondary | ICD-10-CM | POA: Diagnosis not present

## 2018-10-25 DIAGNOSIS — M545 Low back pain: Secondary | ICD-10-CM | POA: Diagnosis not present

## 2018-10-25 DIAGNOSIS — M25569 Pain in unspecified knee: Secondary | ICD-10-CM | POA: Diagnosis not present

## 2018-10-30 DIAGNOSIS — M25569 Pain in unspecified knee: Secondary | ICD-10-CM | POA: Diagnosis not present

## 2018-10-30 DIAGNOSIS — M545 Low back pain: Secondary | ICD-10-CM | POA: Diagnosis not present

## 2018-11-14 DIAGNOSIS — M25569 Pain in unspecified knee: Secondary | ICD-10-CM | POA: Diagnosis not present

## 2018-11-14 DIAGNOSIS — M545 Low back pain: Secondary | ICD-10-CM | POA: Diagnosis not present

## 2018-11-19 DIAGNOSIS — M545 Low back pain: Secondary | ICD-10-CM | POA: Diagnosis not present

## 2018-11-19 DIAGNOSIS — M25569 Pain in unspecified knee: Secondary | ICD-10-CM | POA: Diagnosis not present

## 2018-11-28 DIAGNOSIS — M25569 Pain in unspecified knee: Secondary | ICD-10-CM | POA: Diagnosis not present

## 2018-11-28 DIAGNOSIS — M545 Low back pain: Secondary | ICD-10-CM | POA: Diagnosis not present

## 2018-12-02 DIAGNOSIS — M545 Low back pain: Secondary | ICD-10-CM | POA: Diagnosis not present

## 2018-12-02 DIAGNOSIS — M25569 Pain in unspecified knee: Secondary | ICD-10-CM | POA: Diagnosis not present

## 2018-12-06 DIAGNOSIS — M545 Low back pain: Secondary | ICD-10-CM | POA: Diagnosis not present

## 2018-12-06 DIAGNOSIS — M25569 Pain in unspecified knee: Secondary | ICD-10-CM | POA: Diagnosis not present

## 2018-12-10 DIAGNOSIS — M545 Low back pain: Secondary | ICD-10-CM | POA: Diagnosis not present

## 2018-12-10 DIAGNOSIS — M25569 Pain in unspecified knee: Secondary | ICD-10-CM | POA: Diagnosis not present

## 2018-12-12 DIAGNOSIS — M25569 Pain in unspecified knee: Secondary | ICD-10-CM | POA: Diagnosis not present

## 2018-12-12 DIAGNOSIS — M545 Low back pain: Secondary | ICD-10-CM | POA: Diagnosis not present

## 2018-12-14 DIAGNOSIS — J069 Acute upper respiratory infection, unspecified: Secondary | ICD-10-CM | POA: Diagnosis not present

## 2019-01-07 DIAGNOSIS — M545 Low back pain: Secondary | ICD-10-CM | POA: Diagnosis not present

## 2019-01-07 DIAGNOSIS — M25569 Pain in unspecified knee: Secondary | ICD-10-CM | POA: Diagnosis not present

## 2019-01-21 DIAGNOSIS — M25569 Pain in unspecified knee: Secondary | ICD-10-CM | POA: Diagnosis not present

## 2019-01-21 DIAGNOSIS — M545 Low back pain: Secondary | ICD-10-CM | POA: Diagnosis not present

## 2019-01-28 DIAGNOSIS — M25569 Pain in unspecified knee: Secondary | ICD-10-CM | POA: Diagnosis not present

## 2019-01-28 DIAGNOSIS — M545 Low back pain: Secondary | ICD-10-CM | POA: Diagnosis not present

## 2019-02-04 DIAGNOSIS — M545 Low back pain: Secondary | ICD-10-CM | POA: Diagnosis not present

## 2019-02-04 DIAGNOSIS — M25569 Pain in unspecified knee: Secondary | ICD-10-CM | POA: Diagnosis not present

## 2019-02-11 DIAGNOSIS — M545 Low back pain: Secondary | ICD-10-CM | POA: Diagnosis not present

## 2019-02-11 DIAGNOSIS — M25569 Pain in unspecified knee: Secondary | ICD-10-CM | POA: Diagnosis not present

## 2019-03-18 DIAGNOSIS — Z79899 Other long term (current) drug therapy: Secondary | ICD-10-CM | POA: Diagnosis not present

## 2019-03-18 DIAGNOSIS — Z125 Encounter for screening for malignant neoplasm of prostate: Secondary | ICD-10-CM | POA: Diagnosis not present

## 2019-03-18 DIAGNOSIS — E1169 Type 2 diabetes mellitus with other specified complication: Secondary | ICD-10-CM | POA: Diagnosis not present

## 2019-03-19 DIAGNOSIS — N401 Enlarged prostate with lower urinary tract symptoms: Secondary | ICD-10-CM | POA: Diagnosis not present

## 2019-03-25 ENCOUNTER — Other Ambulatory Visit: Payer: Self-pay | Admitting: Thoracic Surgery (Cardiothoracic Vascular Surgery)

## 2019-03-25 DIAGNOSIS — I10 Essential (primary) hypertension: Secondary | ICD-10-CM | POA: Diagnosis not present

## 2019-03-25 DIAGNOSIS — K219 Gastro-esophageal reflux disease without esophagitis: Secondary | ICD-10-CM | POA: Diagnosis not present

## 2019-03-25 DIAGNOSIS — N400 Enlarged prostate without lower urinary tract symptoms: Secondary | ICD-10-CM | POA: Diagnosis not present

## 2019-03-25 DIAGNOSIS — Z1331 Encounter for screening for depression: Secondary | ICD-10-CM | POA: Diagnosis not present

## 2019-03-25 DIAGNOSIS — I77811 Abdominal aortic ectasia: Secondary | ICD-10-CM | POA: Diagnosis not present

## 2019-03-25 DIAGNOSIS — C3490 Malignant neoplasm of unspecified part of unspecified bronchus or lung: Secondary | ICD-10-CM

## 2019-03-25 DIAGNOSIS — J8410 Pulmonary fibrosis, unspecified: Secondary | ICD-10-CM | POA: Diagnosis not present

## 2019-03-25 DIAGNOSIS — I2510 Atherosclerotic heart disease of native coronary artery without angina pectoris: Secondary | ICD-10-CM | POA: Diagnosis not present

## 2019-03-25 DIAGNOSIS — L409 Psoriasis, unspecified: Secondary | ICD-10-CM | POA: Diagnosis not present

## 2019-03-25 DIAGNOSIS — I714 Abdominal aortic aneurysm, without rupture: Secondary | ICD-10-CM | POA: Diagnosis not present

## 2019-03-25 DIAGNOSIS — J449 Chronic obstructive pulmonary disease, unspecified: Secondary | ICD-10-CM | POA: Diagnosis not present

## 2019-03-25 DIAGNOSIS — Z0000 Encounter for general adult medical examination without abnormal findings: Secondary | ICD-10-CM | POA: Diagnosis not present

## 2019-03-25 DIAGNOSIS — M5136 Other intervertebral disc degeneration, lumbar region: Secondary | ICD-10-CM | POA: Diagnosis not present

## 2019-03-27 DIAGNOSIS — R3912 Poor urinary stream: Secondary | ICD-10-CM | POA: Diagnosis not present

## 2019-03-27 DIAGNOSIS — N401 Enlarged prostate with lower urinary tract symptoms: Secondary | ICD-10-CM | POA: Diagnosis not present

## 2019-03-27 DIAGNOSIS — N5201 Erectile dysfunction due to arterial insufficiency: Secondary | ICD-10-CM | POA: Diagnosis not present

## 2019-04-09 ENCOUNTER — Other Ambulatory Visit: Payer: Self-pay | Admitting: Internal Medicine

## 2019-04-09 DIAGNOSIS — Z87891 Personal history of nicotine dependence: Secondary | ICD-10-CM

## 2019-04-10 IMAGING — DX DG CHEST 1V PORT
1 series · 1 of 1 positions shown · non-contrast
Comparison: Chest x-ray from same day at [DATE] a.m.

CLINICAL DATA: Right upper lobe wedge resection.

EXAM:
PORTABLE CHEST 1 VIEW

[chest]
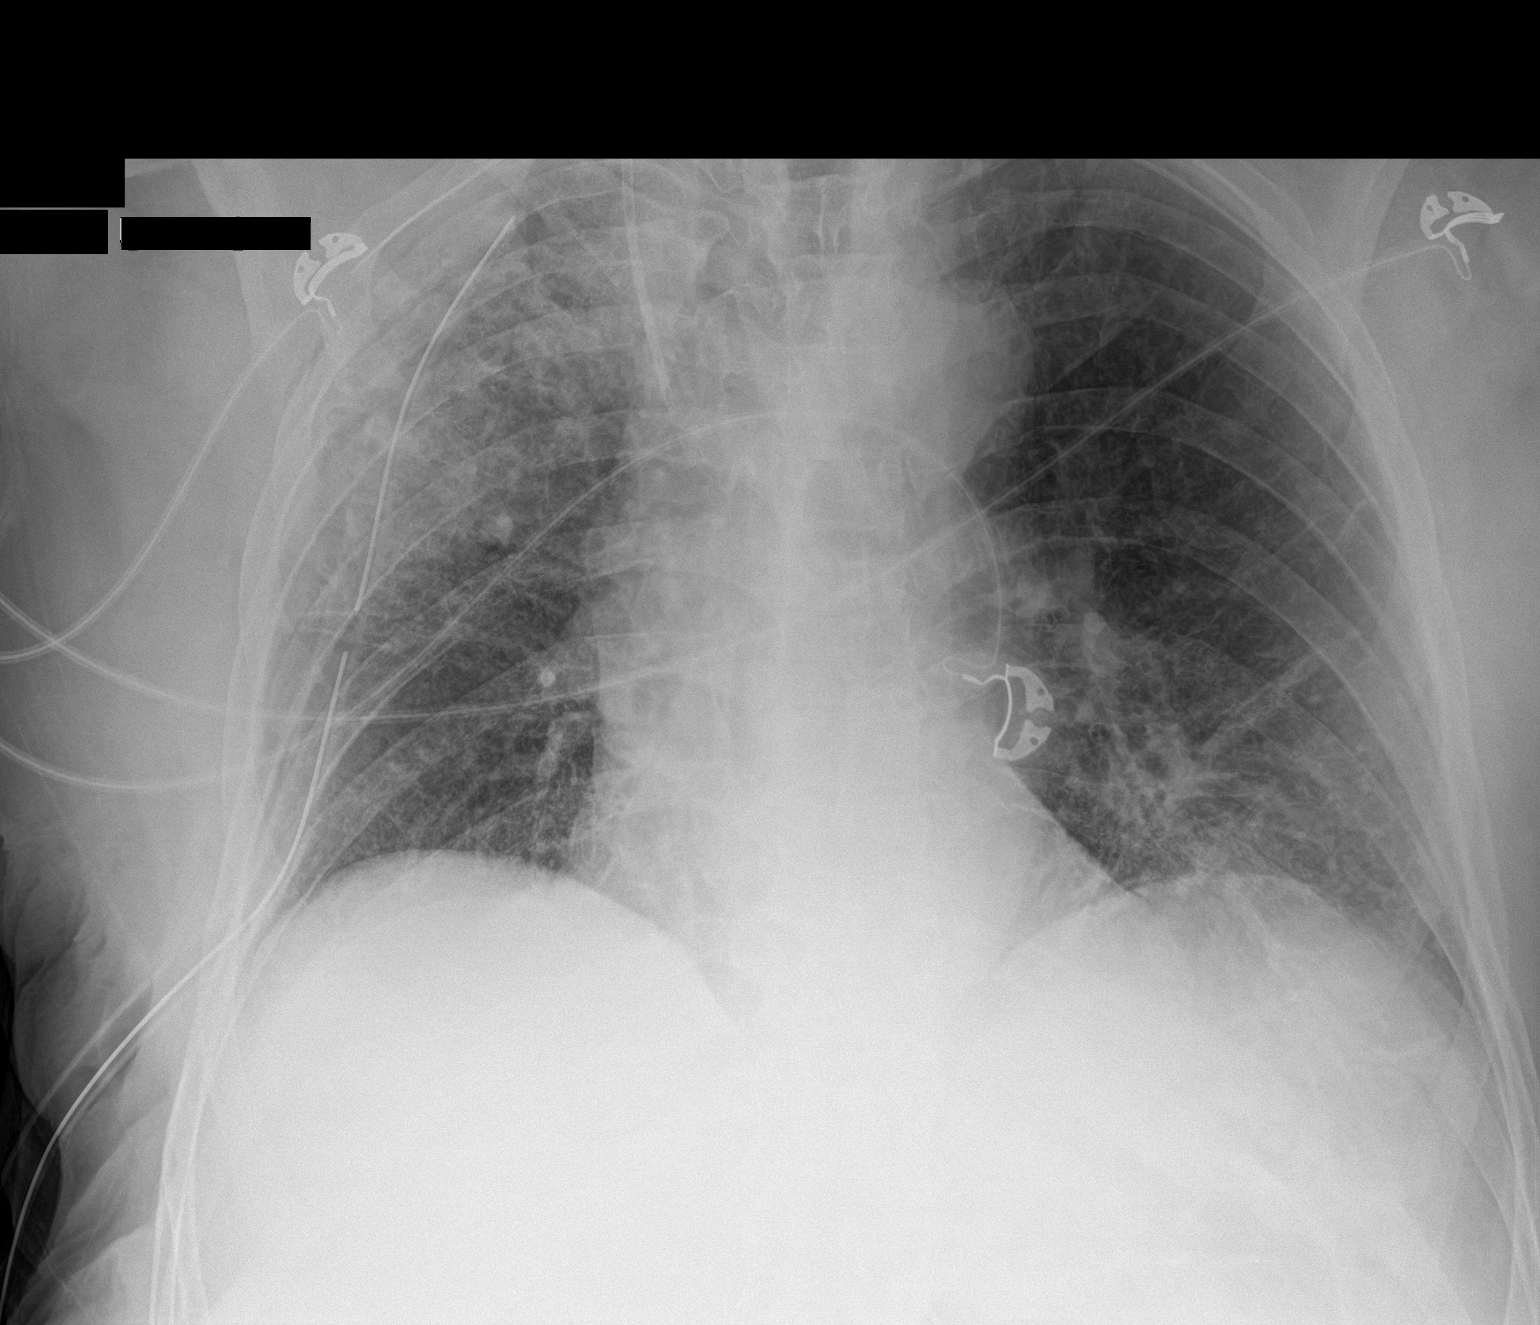

[1 of 1 positions shown; findings below may reference images not displayed]

FINDINGS: Interval placement of a right internal jugular central venous
catheter with the tip at the brachiocephalic vein confluence.
Interval placement of a right-sided chest tube. Postsurgical changes
in the right upper lobe with new mild patchy density in the right
upper lobe. No pleural effusion or pneumothorax. Emphysematous
changes again noted. The heart size and mediastinal contours are
within normal limits. No acute osseous abnormality. Small amount of
subcutaneous emphysema along the right lateral chest wall.
IMPRESSION: 1. Postsurgical changes related to right upper lobe wedge resection
with chest tube placement. No pneumothorax.
2. New patchy density in the right upper lobe may reflect hemorrhage
or edema.

## 2019-04-13 IMAGING — CR DG CHEST 2V
2 series · 2 of 2 positions shown · non-contrast
Comparison: 06/05/2018

CLINICAL DATA: Pneumothorax

EXAM:
CHEST - 2 VIEW

[chest pa]
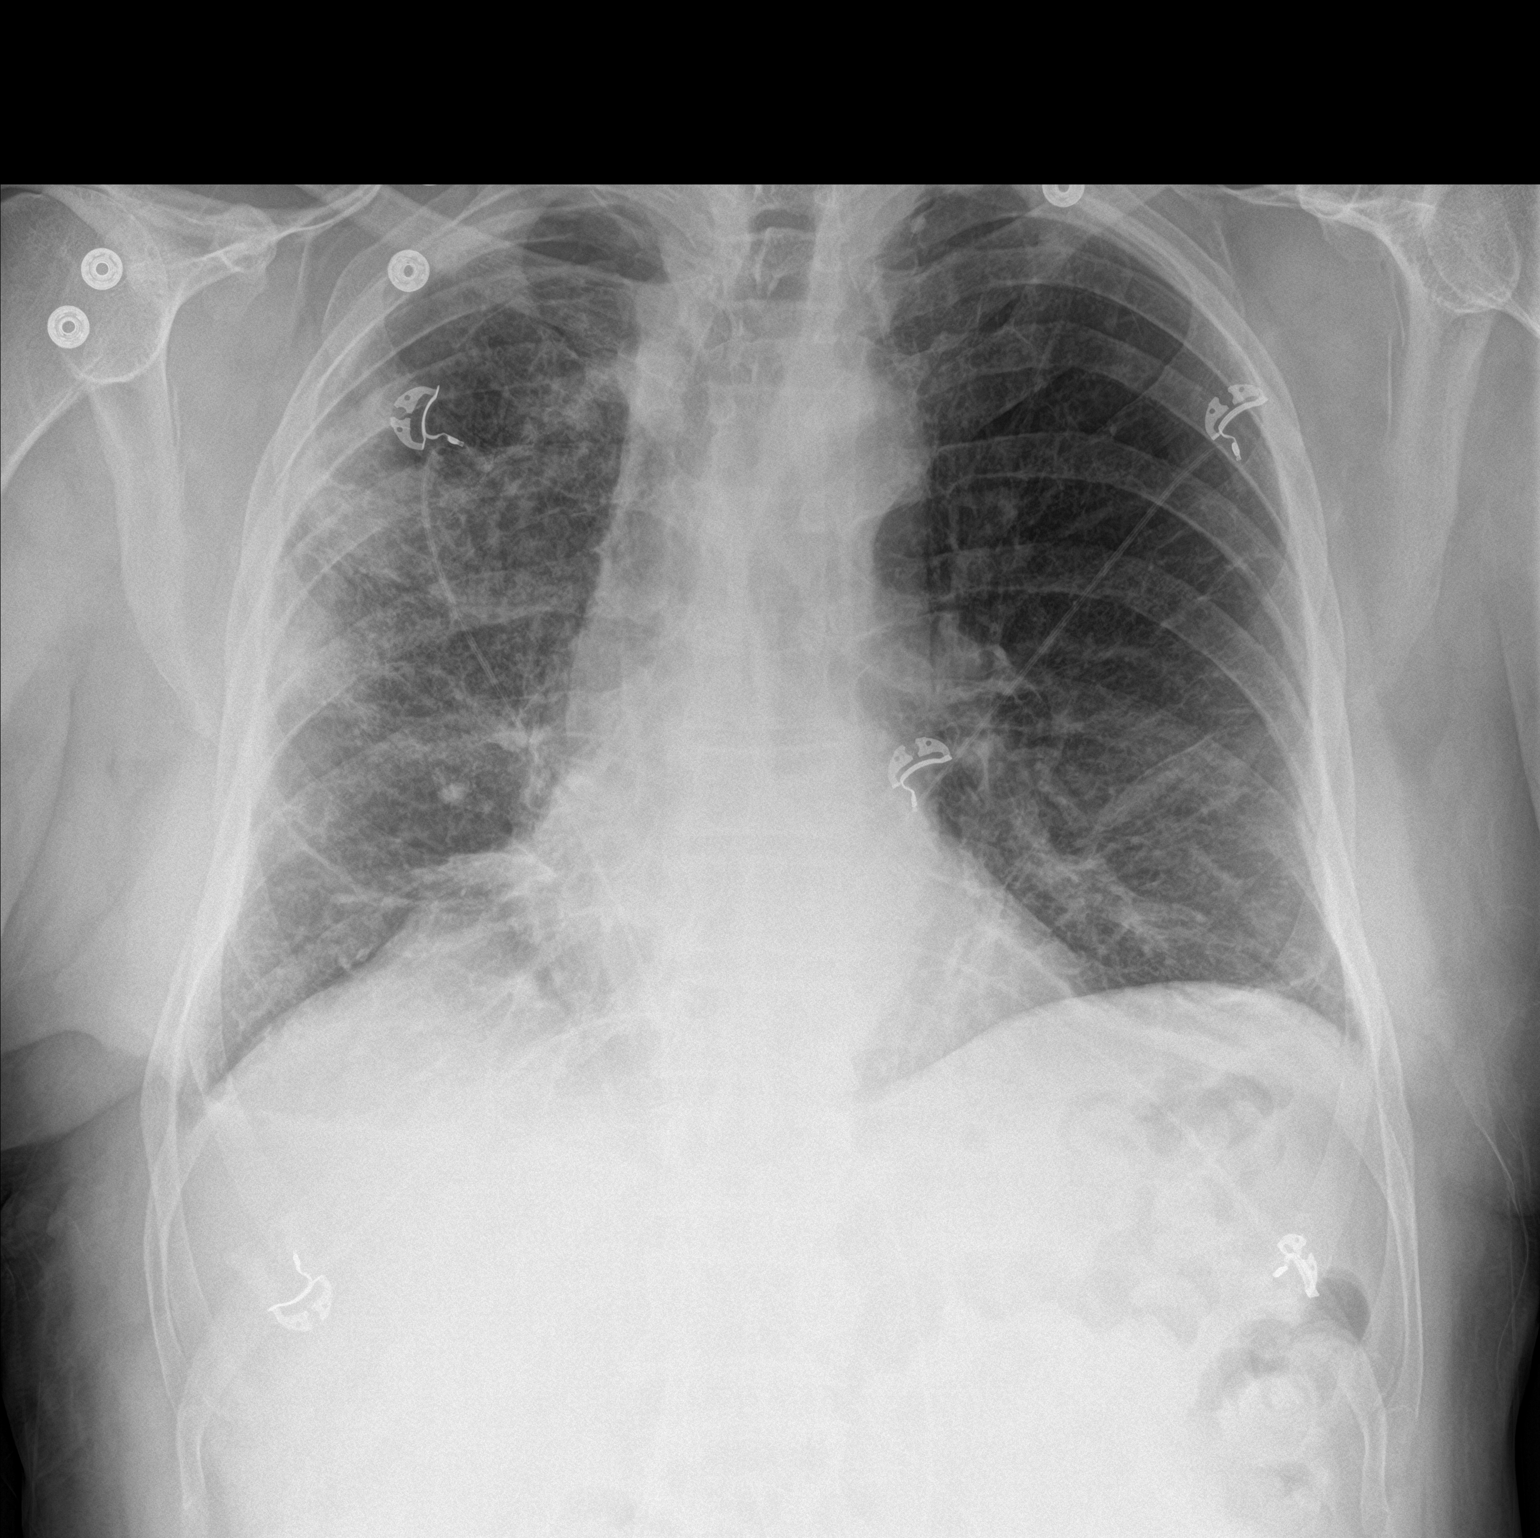

[chest lat]
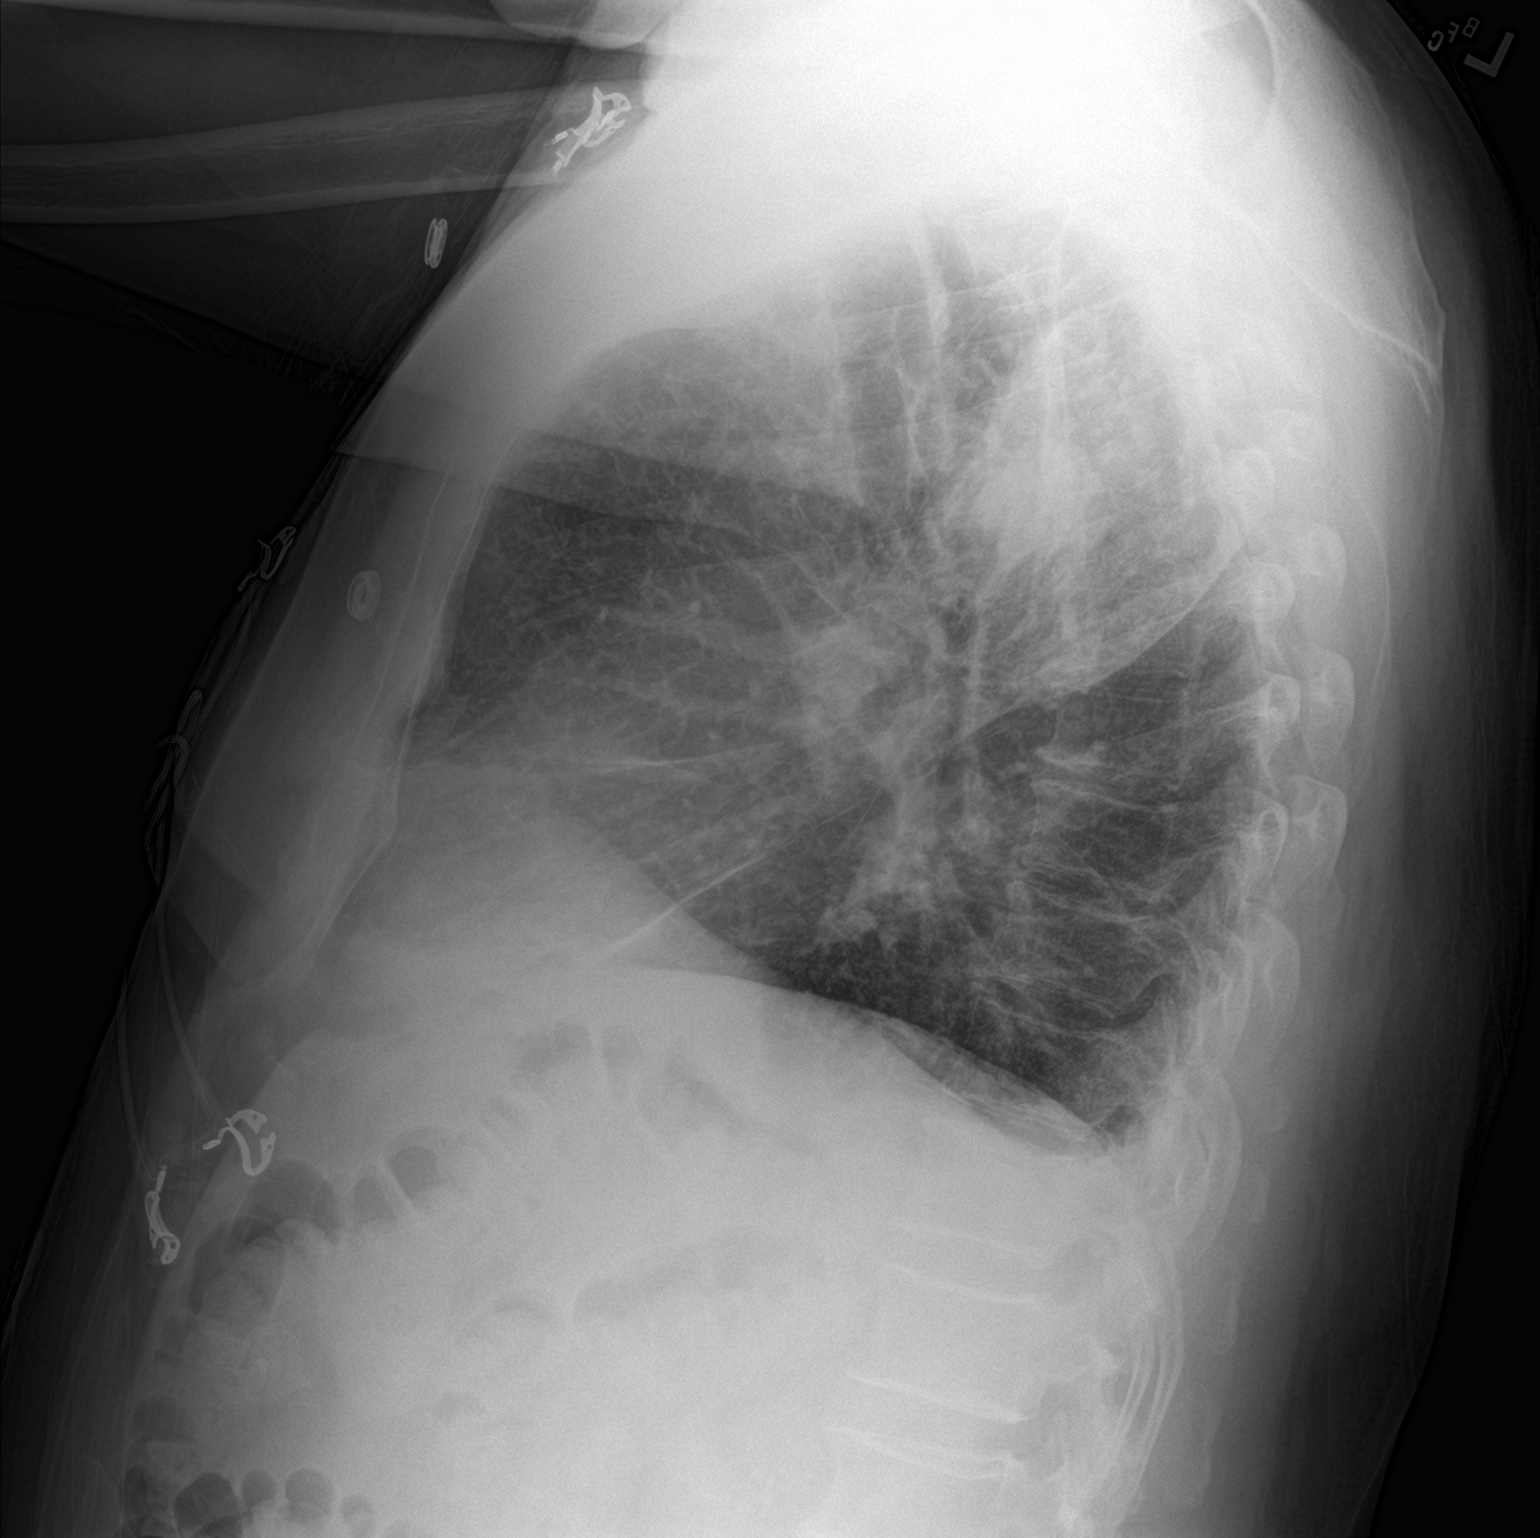

[2 of 2 positions shown; findings below may reference images not displayed]

FINDINGS: Previously seen small right apical pneumothorax no longer
visualized. Patchy right lung airspace disease and opacities at the
left base are stable. Small right pleural effusion. Heart is normal
size.
IMPRESSION: No visible pneumothorax currently.

Stable patchy bilateral airspace disease, right greater than left.

## 2019-04-30 ENCOUNTER — Encounter: Payer: Self-pay | Admitting: Gastroenterology

## 2019-04-30 ENCOUNTER — Ambulatory Visit (INDEPENDENT_AMBULATORY_CARE_PROVIDER_SITE_OTHER): Payer: Medicare Other | Admitting: Gastroenterology

## 2019-04-30 ENCOUNTER — Other Ambulatory Visit: Payer: Self-pay

## 2019-04-30 VITALS — Ht 73.75 in | Wt 237.0 lb

## 2019-04-30 DIAGNOSIS — R194 Change in bowel habit: Secondary | ICD-10-CM

## 2019-04-30 DIAGNOSIS — K219 Gastro-esophageal reflux disease without esophagitis: Secondary | ICD-10-CM

## 2019-04-30 MED ORDER — NA SULFATE-K SULFATE-MG SULF 17.5-3.13-1.6 GM/177ML PO SOLN: 1.0000 | Freq: Once | ORAL | 0 refills | Status: AC

## 2019-04-30 NOTE — Addendum Note (Signed)
Addended by: Elias Else on: 04/30/2019 11:46 AM   Modules accepted: Orders

## 2019-04-30 NOTE — Patient Instructions (Signed)
If you are age 72 or older, your body mass index should be between 23-30. Your Body mass index is 30.64 kg/m. If this is out of the aforementioned range listed, please consider follow up with your Primary Care Provider.  If you are age 36 or younger, your body mass index should be between 19-25. Your Body mass index is 30.64 kg/m. If this is out of the aformentioned range listed, please consider follow up with your Primary Care Provider.   You have been scheduled for a colonoscopy. Please follow written instructions given to you at your visit today.  Please pick up your prep supplies at the pharmacy within the next 1-3 days. If you use inhalers (even only as needed), please bring them with you on the day of your procedure.

## 2019-04-30 NOTE — Progress Notes (Signed)
This patient contacted our office requesting a physician telemedicine video consultation regarding clinical questions and/or test results. Interactive audio and video telecommunications were attempted between this provider and the patient.  However, this technology failed due to the patient having technical difficulties OR they did not have access to video capabilities.  We continued and completed the visit with audio only.  If new patient, they were referred by Dr. Velna Hatchet  Participants on the conference : myself and patient   The patient consented to phone consultation and was aware that a charge will be placed through their insurance.  I was in my office and the patient was at home   Encounter time:  Total time 45 minutes, with 31 minutes spent with patient on phone/webex    Joseph Lund, MD   _____________________________________________________________________________________________          Joseph Decker [facility] Consult Note:  History: Joseph Decker 04/30/2019  Referring physician: Velna Hatchet, MD  Reason for consult/chief complaint: altered bowel habits   Subjective  HPI: Joseph Decker describes years of intermittent post-prandial diarrhea, perhaps triggered by certain foods or larger portions and spicy.  Not much last 6 weeks since not eating out.  Kaopectate will settle it down within a day. Happens few times a year, but may last a week or better. Rarely symptoms overnight. Intermittent lower abdominal cramps. Has yogurt and a probiotic daily as well as fiber cereal.  Frequent regurgitation and belching (less so pyrosis for many years.  Improved with gas-X No rectal bleeding.  Says annual stool testing has been negative for occult blood with PCP.    ROS:  Review of Systems  He denies pain, chronic cough, fever dysuria or weight loss.  He has dyspnea with heavy exertion.  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 Headaches  Depression and anxiety with PTSD  Remainder of systems negative except as above  Past Medical History: Past Medical History:  Diagnosis Date  . Agent orange exposure   . Arthritis   . CAD (coronary artery disease)    coronary calcification on CT 04/10/17  . COPD (chronic obstructive pulmonary disease) (HCC)    emphysema  . DDD (degenerative disc disease), lumbar   . Ectatic abdominal aorta ([place])    04/10/17 U/S: 2.7 cm abdominal aorta, recommend rescreening in 5 years (Dr. Christen Butter)  . Enlarged prostate   . GERD (gastroesophageal reflux disease)   . Hyperglycemia   . Hyperlipidemia   . Hypertension   . Macrocytosis without anemia   . Narcolepsy    pt states he is self diagnosed  . Peripheral neuropathy   . Pneumonia    during his service in [place]  . Pre-diabetes   . Psoriasis   . PTSD (post-traumatic stress disorder)    [place] war veteran  . Sinus headache    I reviewed cardiothoracic surgery office note of 09/03/2018, describing how patient had a hypermetabolic lung nodule found on screening CT scan in the spring 2019.  VATS and wedge resection was performed July 2019, and the nodule was a necrotizing granuloma.  Past Surgical History: Past Surgical History:  Procedure Laterality Date  . BACK SURGERY     lumbar discectomy  . COLONOSCOPY    . REPLACEMENT TOTAL KNEE BILATERAL    . TONSILLECTOMY    . VIDEO ASSISTED THORACOSCOPY (VATS)/WEDGE RESECTION Right 06/03/2018   Procedure: VIDEO ASSISTED THORACOSCOPY (VATS)/WEDGE RESECTION for nodule;  Surgeon: Roxan Hockey,  Revonda Standard, MD;  Location[facility];  Service: Thoracic;  Laterality: Right;     Family History: Family History  Problem Relation Age of Onset  . Alzheimer's disease Mother   . Lead poisoning Father   . Colon cancer Neg Hx   . Stomach cancer Neg Hx   . Esophageal cancer Neg Hx   . Pancreatic cancer Neg Hx     Social History: Social History   Socioeconomic History  .  Marital status: Single    Spouse name: Not on file  . Number of children: Not on file  . Years of education: Not on file  . Highest education level: Not on file  Occupational History  . Not on file  Social Needs  . Financial resource strain: Not on file  . Food insecurity:    Worry: Not on file    Inability: Not on file  . Transportation needs:    Medical: Not on file    Non-medical: Not on file  Tobacco Use  . Smoking status: Former Smoker    Packs/day: 1.00    Years: 40.00    Pack years: 40.00  . Smokeless tobacco: Never Used  Substance and Sexual Activity  . Alcohol use: Yes    Comment: rare  . Drug use: No  . Sexual activity: Not on file  Lifestyle  . Physical activity:    Days per week: Not on file    Minutes per session: Not on file  . Stress: Not on file  Relationships  . Social connections:    Talks on phone: Not on file    Gets together: Not on file    Attends religious service: Not on file    Active member of club or organization: Not on file    Attends meetings of clubs or organizations: Not on file    Relationship status: Not on file  Other Topics Concern  . Not on file  Social History Narrative   No children, [place] veteran/combat   90% disabled through [place]   Enjoys bicycle, kayak, fishing, building outdoor projects Originally from [place].  Allergies: Allergies  Allergen Reactions  . Antihistamines, Chlorpheniramine-Type Other (See Comments)    Feels drunk  . Oxycodone Other (See Comments)    Gout symptoms, swollen and painful joints    Outpatient Meds: Current Outpatient Medications  Medication Sig Dispense Refill  . ALPRAZolam (XANAX) 0.5 MG tablet Take 0.5 mg by mouth daily as needed for anxiety.    . Biotin 5000 MCG TABS Take 5,000 mcg by mouth daily.    . calcipotriene (DOVONOX) 0.005 % cream Apply 1 application topically 2 (two) times daily as needed (for elbows).    . Calcium Carb-Cholecalciferol (CALCIUM 600+D3  PO) Take 1 tablet by mouth daily.    . Cholecalciferol (VITAMIN D-3) 5000 units TABS Take 5,000 Units by mouth every other day.    . clobetasol (OLUX) 0.05 % topical foam Apply 1 application topically 2 (two) times daily as needed (for itching scalp).    . Glucosamine-Chondroitin (OSTEO BI-FLEX REGULAR STRENGTH PO) Take 1 tablet by mouth daily.    Marland Kitchen HYDROcodone-acetaminophen (NORCO/VICODIN) 5-325 MG tablet Take 1 tablet by mouth every 6 (six) hours as needed for moderate pain. (Patient not taking: Reported on 09/03/2018) 28 tablet 0  . ibuprofen (ADVIL,MOTRIN) 200 MG tablet Take 400 mg by mouth every 8 (eight) hours as needed (for pain.).    Marland Kitchen methocarbamol (ROBAXIN) 500 MG tablet Take 500 mg by  mouth every 6 (six) hours as needed for muscle spasms.    . Multiple Vitamin (MULTIVITAMIN WITH MINERALS) TABS tablet Take 1 tablet by mouth daily. 2-3 times a week    . Multiple Vitamins-Minerals (PRESERVISION AREDS 2 PO) Take 1 tablet by mouth 2 (two) times daily.    . naproxen (NAPROXEN DR) 500 MG EC tablet Take 500 mg by mouth daily as needed (for pain.).    Marland Kitchen Omega-3 Fatty Acids (EQL OMEGA 3 FISH OIL) 1400 MG CAPS Take 1,400 mg by mouth daily.    Vladimir Faster Glycol-Propyl Glycol (SYSTANE) 0.4-0.3 % SOLN Place 1-2 drops into both eyes 3 (three) times daily as needed (for dry/irritated eyes.).    Marland Kitchen rosuvastatin (CRESTOR) 10 MG tablet Take 10 mg by mouth See admin instructions. TAKE 1 TABLET (10 MG) BY MOUTH 5 TIMES A WEEK IN THE EVENING    . tamsulosin (FLOMAX) 0.4 MG CAPS capsule Take 0.4 mg by mouth daily after supper. 30 minutes after supper    . telmisartan (MICARDIS) 20 MG tablet Take 20 mg by mouth daily.    Marland Kitchen triamcinolone (KENALOG) 0.025 % ointment Apply 1 application topically 2 (two) times daily as needed (for psoriasis (skin breakdown)).    . vitamin E (VITAMIN E) 400 UNIT capsule Take 400 Units by mouth daily.     No current facility-administered medications for this visit.     Has not felt  he needed acid reduction medicine regularly  ___________________________________________________________________ Objective   Exam:   No exam-  Virtual visit   Labs:  Hgb A1c  6.9 Normal CBC  02/2019, also nml CMP, nml TSH   Assessment: Encounter Diagnoses  Name Primary?  . Change in bowel habits Yes  . Gastroesophageal reflux disease, esophagitis presence not specified     Intermittent of diarrhea, unexplained. Years of GERD - should be screened for Barrett's  Plan:  EGD and colonoscopy this month or next in [place].  Thank you for the courtesy of this consult.  Please call me with any questions or concerns.  Nelida Meuse III  CC: Referring provider noted above

## 2019-05-13 ENCOUNTER — Telehealth: Payer: Self-pay | Admitting: *Deleted

## 2019-05-13 NOTE — Telephone Encounter (Signed)
Covid-19 travel screening questions  Have you traveled in the last 14 days? no If yes where?  Do you now or have you had a fever in the last 14 days? no  Do you have any respiratory symptoms of shortness of breath or cough now or in the last 14 days? no  Do you have any family members or close contacts with diagnosed or suspected Covid-19? no       

## 2019-05-13 NOTE — Telephone Encounter (Signed)
LM on VM for pt to call back to go over covid 19 screening questions.

## 2019-05-14 ENCOUNTER — Telehealth: Payer: Self-pay | Admitting: *Deleted

## 2019-05-14 NOTE — Telephone Encounter (Signed)
Pt called back and wanted to speak with you about moving appt. However he is wanting an explanation on why he has to move his appt and will like a call back.

## 2019-05-14 NOTE — Telephone Encounter (Signed)
Attempting to call patient to see if we can move appointment. Will need to deliver new prep instructions.

## 2019-05-14 NOTE — Telephone Encounter (Signed)
Attempted to reach patient again after he had returned my phone call, please connect him to me at [phone].  Reached patient he was agreeable to changing appointment. Instructed him to be here at 1200 pm for a 1 pm appointment. He will start prep at 800 am and be finished drinking by 1000 am

## 2019-05-15 ENCOUNTER — Encounter: Payer: Self-pay | Admitting: Gastroenterology

## 2019-05-15 ENCOUNTER — Other Ambulatory Visit: Payer: Self-pay

## 2019-05-15 ENCOUNTER — Ambulatory Visit (AMBULATORY_SURGERY_CENTER): Payer: Medicare Other | Admitting: Gastroenterology

## 2019-05-15 VITALS — BP 103/59 | HR 56 | Temp 98.4°F | Resp 13 | Ht 73.75 in | Wt 237.0 lb

## 2019-05-15 DIAGNOSIS — R194 Change in bowel habit: Secondary | ICD-10-CM

## 2019-05-15 DIAGNOSIS — K219 Gastro-esophageal reflux disease without esophagitis: Secondary | ICD-10-CM | POA: Diagnosis not present

## 2019-05-15 DIAGNOSIS — I10 Essential (primary) hypertension: Secondary | ICD-10-CM | POA: Diagnosis not present

## 2019-05-15 DIAGNOSIS — J449 Chronic obstructive pulmonary disease, unspecified: Secondary | ICD-10-CM | POA: Diagnosis not present

## 2019-05-15 DIAGNOSIS — K529 Noninfective gastroenteritis and colitis, unspecified: Secondary | ICD-10-CM

## 2019-05-15 DIAGNOSIS — K5730 Diverticulosis of large intestine without perforation or abscess without bleeding: Secondary | ICD-10-CM | POA: Diagnosis not present

## 2019-05-15 MED ORDER — SODIUM CHLORIDE 0.9 % IV SOLN: 500.0000 mL | Freq: Once | INTRAVENOUS | Status: DC

## 2019-05-15 NOTE — Patient Instructions (Signed)
YOU HAD AN ENDOSCOPIC PROCEDURE TODAY AT [facility]:   Refer to the procedure report that was given to you for any specific questions about what was found during the examination.  If the procedure report does not answer your questions, please call your gastroenterologist to clarify.  If you requested that your care partner not be given the details of your procedure findings, then the procedure report has been included in a sealed envelope for you to review at your convenience later.  YOU SHOULD EXPECT: Some feelings of bloating in the abdomen. Passage of more gas than usual.  Walking can help get rid of the air that was put into your GI tract during the procedure and reduce the bloating. If you had a lower endoscopy (such as a colonoscopy or flexible sigmoidoscopy) you may notice spotting of blood in your stool or on the toilet paper. If you underwent a bowel prep for your procedure, you may not have a normal bowel movement for a few days.  Please Note:  You might notice some irritation and congestion in your nose or some drainage.  This is from the oxygen used during your procedure.  There is no need for concern and it should clear up in a day or so.  SYMPTOMS TO REPORT IMMEDIATELY:   Following lower endoscopy (colonoscopy or flexible sigmoidoscopy):  Excessive amounts of blood in the stool  Significant tenderness or worsening of abdominal pains  Swelling of the abdomen that is new, acute  Fever of 100F or higher   Following upper endoscopy (EGD)  Vomiting of blood or coffee ground material  New chest pain or pain under the shoulder blades  Painful or persistently difficult swallowing  New shortness of breath  Fever of 100F or higher  Black, tarry-looking stools  For urgent or emergent issues, a gastroenterologist can be reached at any hour by calling [phone].   DIET:  We do recommend a small meal at first, but then you may proceed to your regular diet.  Drink  plenty of fluids but you should avoid alcoholic beverages for 24 hours.  ACTIVITY:  You should plan to take it easy for the rest of today and you should NOT DRIVE or use heavy machinery until tomorrow (because of the sedation medicines used during the test).    FOLLOW UP: Our staff will call the number listed on your records 48-72 hours following your procedure to check on you and address any questions or concerns that you may have regarding the information given to you following your procedure. If we do not reach you, we will leave a message.  We will attempt to reach you two times.  During this call, we will ask if you have developed any symptoms of COVID 19. If you develop any symptoms (for example fever, flu-like symptoms, shortness of breath, cough etc.) before then, please call [phone].  If any biopsies were taken you will be contacted by phone or by letter within the next 1-3 weeks.  Please call us at [phone] if you have not heard about the biopsies in 3 weeks.    SIGNATURES/CONFIDENTIALITY: You and/or your care partner have signed paperwork which will be entered into your electronic medical record.  These signatures attest to the fact that that the information above on your After Visit Summary has been reviewed and is understood.  Full responsibility of the confidentiality of this discharge information lies with you and/or your care-partner.   Thank you for allowing  [place] to provide your healthcare today.

## 2019-05-15 NOTE — Progress Notes (Signed)
Temp taken by CW Vital signs done by JB

## 2019-05-15 NOTE — Progress Notes (Signed)
Report given to PACU, vss 

## 2019-05-15 NOTE — Progress Notes (Signed)
Called to room to assist during endoscopic procedure.  Patient ID and intended procedure confirmed with present staff. Received instructions for my participation in the procedure from the performing physician.  

## 2019-05-15 NOTE — Op Note (Addendum)
[place] Patient Name: Joseph Decker Procedure Date: 05/15/2019 1:08 PM MRN: [id number] Endoscopist: Mallie Mussel L. Loletha Carrow , MD Age: 73 Referring MD:  Date of Birth: 18-Jan-1946 Gender: Male Account #: [id number] Procedure:                Colonoscopy Indications:              Chronic diarrhea (intermittent) Medicines:                Monitored Anesthesia Care Procedure:                Pre-Anesthesia Assessment:                           - Prior to the procedure, a History and Physical                            was performed, and patient medications and                            allergies were reviewed. The patient's tolerance of                            previous anesthesia was also reviewed. The risks                            and benefits of the procedure and the sedation                            options and risks were discussed with the patient.                            All questions were answered, and informed consent                            was obtained. Prior Anticoagulants: The patient has                            taken no previous anticoagulant or antiplatelet                            agents except for aspirin. ASA Grade Assessment: II                            - A patient with mild systemic disease. After                            reviewing the risks and benefits, the patient was                            deemed in satisfactory condition to undergo the                            procedure.  After obtaining informed consent, the colonoscope                            was passed under direct vision. Throughout the                            procedure, the patient's blood pressure, pulse, and                            oxygen saturations were monitored continuously. The                            Model CF-[id number]) scope was introduced                            through the anus and advanced to the the terminal                 ileum, with identification of the appendiceal                            orifice and IC valve. The colonoscopy was performed                            without difficulty. The patient tolerated the                            procedure well. The quality of the bowel                            preparation was excellent. The terminal ileum,                            ileocecal valve, appendiceal orifice, and rectum                            were photographed. Scope In: 1:23:51 PM Scope Out: 1:34:26 PM Scope Withdrawal Time: 0 hours 8 minutes 32 seconds  Total Procedure Duration: 0 hours 10 minutes 35 seconds  Findings:                 The perianal and digital rectal examinations were                            normal.                           The terminal ileum appeared normal.                           A few diverticula were found in the left colon and                            right colon.                           Normal mucosa was  found in the entire colon.                            Biopsies for histology were taken with a cold                            forceps from the right colon and left colon for                            evaluation of microscopic colitis.                           The exam was otherwise without abnormality on                            direct and retroflexion views. Complications:            No immediate complications. Estimated Blood Loss:     Estimated blood loss was minimal. Impression:               - The examined portion of the ileum was normal.                           - Diverticulosis in the left colon and in the right                            colon.                           - Normal mucosa in the entire examined colon.                            Biopsied.                           - The examination was otherwise normal on direct                            and retroflexion views. Recommendation:           - Patient has a contact  number available for                            emergencies. The signs and symptoms of potential                            delayed complications were discussed with the                            patient. Return to normal activities tomorrow.                            Written discharge instructions were provided to the                            patient.                           -  Resume previous diet.                           - Continue present medications.                           - Await pathology results.                           - No repeat screening colonoscopy due to age.                           - See the other procedure note for documentation of                            additional recommendations. Shakiera Edelson L. Loletha Carrow, MD 05/15/2019 1:51:25 PM This report has been signed electronically.

## 2019-05-15 NOTE — Op Note (Signed)
[place] Patient Name: Joseph Decker Procedure Date: 05/15/2019 1:07 PM MRN: [id number] Endoscopist: Mallie Mussel L. Loletha Carrow , MD Age: 73 Referring MD:  Date of Birth: [birth year] Gender: Male Account #: [id number] Procedure:                Upper GI endoscopy Indications:              Esophageal reflux symptoms that persist despite                            appropriate therapy Medicines:                Monitored Anesthesia Care Procedure:                Pre-Anesthesia Assessment:                           - Prior to the procedure, a History and Physical                            was performed, and patient medications and                            allergies were reviewed. The patient's tolerance of                            previous anesthesia was also reviewed. The risks                            and benefits of the procedure and the sedation                            options and risks were discussed with the patient.                            All questions were answered, and informed consent                            was obtained. Prior Anticoagulants: The patient has                            taken no previous anticoagulant or antiplatelet                            agents except for aspirin. ASA Grade Assessment: II                            - A patient with mild systemic disease. After                            reviewing the risks and benefits, the patient was                            deemed in satisfactory condition to undergo the  procedure.                           After obtaining informed consent, the endoscope was                            passed under direct vision. Throughout the                            procedure, the patient's blood pressure, pulse, and                            oxygen saturations were monitored continuously. The                            Endoscope was introduced through the mouth, and      advanced to the second part of duodenum. The upper                            GI endoscopy was accomplished without difficulty.                            The patient tolerated the procedure well. Scope In: Scope Out: Findings:                 The larynx was normal.                           The esophagus was normal.                           Patchy erythematous mucosa was found in the gastric                            antrum. Biopsies were taken with a cold forceps for                            histology. ([facility]ney protocol).                           The exam of the stomach was otherwise normal.                           The examined duodenum was normal. Complications:            No immediate complications. Estimated Blood Loss:     Estimated blood loss was minimal. Impression:               - Normal larynx.                           - Normal esophagus.                           - Erythematous mucosa in the antrum. Biopsied.                           -  Normal examined duodenum. Recommendation:           - Patient has a contact number available for                            emergencies. The signs and symptoms of potential                            delayed complications were discussed with the                            patient. Return to normal activities tomorrow.                            Written discharge instructions were provided to the                            patient.                           - Resume previous diet.                           - Continue present medications.                           - Await pathology results. Nefi Musich L. Loletha Carrow, MD 05/15/2019 1:55:01 PM This report has been signed electronically.

## 2019-05-16 ENCOUNTER — Telehealth: Payer: Self-pay | Admitting: Gastroenterology

## 2019-05-16 NOTE — Telephone Encounter (Signed)
Patient already had their  procedure on 5/21  [facility] [employer] needs to relay this information to patient so he can cancel his request  Tried to call patient and there was no answer  He just needs to contact his insurance

## 2019-05-16 NOTE — Telephone Encounter (Signed)
Billie from pt insurance called on behalf of the mm. She stated that mm initiated approval for suprep bowel prep solution and they are needing clinical information. She also stated that she was advised that the pt has recv. A sample however she can not close out or cx request until the pt calls himself to cx because he initiated it.

## 2019-05-17 ENCOUNTER — Telehealth: Payer: Self-pay | Admitting: *Deleted

## 2019-05-17 NOTE — Telephone Encounter (Signed)
No answer for first follow up attempt will call back. SM

## 2019-05-18 ENCOUNTER — Telehealth: Payer: Self-pay | Admitting: *Deleted

## 2019-05-18 NOTE — Telephone Encounter (Signed)
No answer for post of follow up call and unable to leave message. SM

## 2019-05-20 ENCOUNTER — Encounter: Payer: Self-pay | Admitting: Gastroenterology

## 2019-05-20 ENCOUNTER — Encounter: Payer: Medicare Other | Admitting: Gastroenterology

## 2019-05-21 NOTE — Telephone Encounter (Signed)
Unable to reach the patient for post procedure call. SM

## 2019-05-26 ENCOUNTER — Other Ambulatory Visit: Payer: Self-pay

## 2019-05-26 ENCOUNTER — Ambulatory Visit
Admission: RE | Admit: 2019-05-26 | Discharge: 2019-05-26 | Disposition: A | Discharge status: Home / Self Care | Payer: Medicare Other | Source: Ambulatory Visit | Attending: Internal Medicine | Admitting: Internal Medicine

## 2019-05-26 DIAGNOSIS — Z87891 Personal history of nicotine dependence: Secondary | ICD-10-CM | POA: Diagnosis not present

## 2019-06-05 ENCOUNTER — Other Ambulatory Visit: Payer: Medicare Other

## 2019-06-09 ENCOUNTER — Other Ambulatory Visit: Payer: Self-pay

## 2019-06-10 ENCOUNTER — Encounter: Payer: Self-pay | Admitting: Thoracic Surgery (Cardiothoracic Vascular Surgery)

## 2019-06-10 ENCOUNTER — Ambulatory Visit (INDEPENDENT_AMBULATORY_CARE_PROVIDER_SITE_OTHER): Payer: Medicare Other | Admitting: Thoracic Surgery (Cardiothoracic Vascular Surgery)

## 2019-06-10 VITALS — BP 147/82 | HR 70 | Temp 97.8°F | Resp 20 | Ht 74.0 in | Wt 238.0 lb

## 2019-06-10 DIAGNOSIS — R911 Solitary pulmonary nodule: Secondary | ICD-10-CM

## 2019-06-10 NOTE — Progress Notes (Signed)
HPI: Mr. Leser returns for a scheduled follow-up visit  Jakub Debold is a 73 year old with a history of tobacco abuse, anxiety, posttraumatic stress, degenerative joint disease, bilateral total knee replacements, psoriasis, peripheral neuropathy, and lumbar disc disease.  He smoked about a pack of cigarettes daily before quitting 10 years ago.  He was found to have a lung nodule in the right upper lobe on a lung cancer screening CT.  I did a right VATS and wedge resection on 06/03/2018.  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  He has been feeling well.  He has been building a fireplace out of bricks and stone and has been doing all of the work himself.  He continues to ride his bike about 10 miles a day.  He feels his respiratory status is better than it was a year ago.  Past Medical History:  Diagnosis Date  . Agent orange exposure   . Arthritis   . CAD (coronary artery disease)    coronary calcification on CT 04/10/17  . Cataract   . COPD (chronic obstructive pulmonary disease) (HCC)    emphysema  . DDD (degenerative disc disease), lumbar   . Ectatic abdominal aorta ([facility])    04/10/17 U/S: 2.7 cm abdominal aorta, recommend rescreening in 5 years (Dr. Christen Butter)  . Emphysema of lung ([place])   . Enlarged prostate   . GERD (gastroesophageal reflux disease)   . Hyperglycemia   . Hyperlipidemia   . Hypertension   . Macrocytosis without anemia   . Narcolepsy    pt states he is self diagnosed  . Peripheral neuropathy   . Pneumonia    during his service in [place]  . Pre-diabetes   . Psoriasis   . PTSD (post-traumatic stress disorder)    [place] war veteran  . Sinus headache     Current Outpatient Medications  Medication Sig Dispense Refill  . acetaminophen (TYLENOL) 325 MG tablet Take 650 mg by mouth every 6 (six) hours as needed for mild pain.    Marland Kitchen albuterol (VENTOLIN HFA) 108 (90 Base) MCG/ACT inhaler Inhale 2 puffs into the lungs as needed for cough.    . ALPRAZolam (XANAX) 0.5 MG  tablet Take 0.5 mg by mouth daily as needed for anxiety.    . Biotin 5000 MCG TABS Take 5,000 mcg by mouth daily.    . Blood Glucose Monitoring Suppl (FREESTYLE LITE) DEVI Apply 1 application topically daily.    . calcipotriene (DOVONOX) 0.005 % cream Apply 1 application topically 2 (two) times daily as needed (for elbows).    . Calcium Carb-Cholecalciferol (CALCIUM 600+D3 PO) Take 1 tablet by mouth daily.    . Cholecalciferol (VITAMIN D-3) 5000 units TABS Take 5,000 Units by mouth every other day.    . clobetasol (OLUX) 0.05 % topical foam Apply 1 application topically 2 (two) times daily as needed (for itching scalp).    . famotidine (PEPCID) 20 MG tablet Take 20 mg by mouth daily.    . Glucosamine-Chondroitin (OSTEO BI-FLEX REGULAR STRENGTH PO) Take 1 tablet by mouth daily.    Marland Kitchen ibuprofen (ADVIL,MOTRIN) 200 MG tablet Take 400 mg by mouth every 8 (eight) hours as needed (for pain.).    Marland Kitchen methocarbamol (ROBAXIN) 500 MG tablet Take 500 mg by mouth every 6 (six) hours as needed for muscle spasms.    . Multiple Vitamin (MULTIVITAMIN WITH MINERALS) TABS tablet Take 1 tablet by mouth daily. 2-3 times a week    . Multiple Vitamins-Minerals (PRESERVISION AREDS 2 PO) Take  1 tablet by mouth 2 (two) times daily.    . naproxen (NAPROXEN DR) 500 MG EC tablet Take 500 mg by mouth daily as needed (for pain.).    Marland Kitchen Omega-3 Fatty Acids (EQL OMEGA 3 FISH OIL) 1400 MG CAPS Take 1,400 mg by mouth daily.    Vladimir Faster Glycol-Propyl Glycol (SYSTANE) 0.4-0.3 % SOLN Place 1-2 drops into both eyes 3 (three) times daily as needed (for dry/irritated eyes.).    Marland Kitchen rosuvastatin (CRESTOR) 10 MG tablet Take 10 mg by mouth See admin instructions. TAKE 1 TABLET (10 MG) BY MOUTH 5 TIMES A WEEK IN THE EVENING    . tamsulosin (FLOMAX) 0.4 MG CAPS capsule Take 0.4 mg by mouth daily after supper. 30 minutes after supper    . telmisartan (MICARDIS) 20 MG tablet Take 20 mg by mouth daily.    Marland Kitchen triamcinolone (KENALOG) 0.025 % ointment  Apply 1 application topically 2 (two) times daily as needed (for psoriasis (skin breakdown)).    . vitamin E (VITAMIN E) 400 UNIT capsule Take 400 Units by mouth daily.     No current facility-administered medications for this visit.     Physical Exam BP (!) 147/82   Pulse 70   Temp 97.8 F (36.6 C) (Skin)   Resp 20   Ht 6\' 2"  (1.88 m)   Wt 238 lb (108 kg)   SpO2 97% Comment: RA  BMI 30.42 kg/m  73 year old man in no acute distress Alert and oriented x3 with no focal deficits Cardiac regular rate and rhythm with normal S1 and S2 Lungs clear with equal breath sounds bilaterally No peripheral edema  Diagnostic Tests: CT CHEST WITHOUT CONTRAST LOW-DOSE FOR LUNG CANCER SCREENING  TECHNIQUE: Multidetector CT imaging of the chest was performed following the standard protocol without IV contrast.  COMPARISON:  09/03/2018 plain film. Screening CT of 04/03/2018. PET of 05/01/2018.  FINDINGS: Cardiovascular: Aortic and branch vessel atherosclerosis. Tortuous thoracic aorta. Normal heart size, without pericardial effusion. Multivessel coronary artery atherosclerosis.  Mediastinum/Nodes: 9 mm right paratracheal node is unchanged and not pathologic by size criteria.  A subcarinal node measures 11 mm and is similar, upper normal. Hilar regions poorly evaluated without intravenous contrast.  Mildly dilated esophagus with fluid level within on image 35/2.  Lungs/Pleura: No pleural fluid. Moderate bullous type emphysema. Left apical calcified granuloma.  Interval right upper lobe wedge resection. Patchy opacities surrounding the surgical sutures are presumably indicative of scarring. Tiny left upper lobe pulmonary nodules of maximally volume derived equivalent diameter 3.3 mm.  Upper Abdomen: Mild to moderate hepatic steatosis. Fatty replacement throughout the pancreas. Normal imaged portions of the spleen, stomach, adrenal glands, gallbladder,  kidneys.  Musculoskeletal: No acute osseous abnormality. Mild right hemidiaphragm elevation.  IMPRESSION: 1. Lung-RADS 2, benign appearance or behavior. Continue annual screening with low-dose chest CT without contrast in 12 months. Interval right upper lobe wedge resection for benign pulmonary nodule. Opacities surrounding the surgical sutures are likely areas of scarring. 2. Aortic atherosclerosis (ICD10-I70.0), coronary artery atherosclerosis and emphysema (ICD10-J43.9). 3. Hepatic steatosis. 4. Esophageal air fluid level suggests dysmotility or gastroesophageal reflux.   Electronically Signed   By: Abigail Miyamoto M.D.   On: 05/27/2019 08:21 I personally reviewed the CT images and concur with the findings noted above  Impression: Onesimo Lingard is a 73 year old man with a history of tobacco abuse, necrotizing granuloma, anxiety, posttraumatic stress, arthritis, psoriasis, peripheral neuropathy and lumbar disc disease.  Tobacco abuse-still meets criteria for low-dose lung cancer screening.  His  CT today shows no suspicious lesions.  Repeat CT in 1 year.  Necrotizing granuloma-resected last year.  No signs of any active infection on CT.  Gastroesophageal reflux-esophageal air-fluid level noted on CT.  He just had an endoscopy about 3 weeks ago.  Plan: Return in 1 year with low-dose CT for lung cancer screening  Melrose Nakayama, MD [facility] [phone]

## 2019-09-24 DIAGNOSIS — J8410 Pulmonary fibrosis, unspecified: Secondary | ICD-10-CM | POA: Diagnosis not present

## 2019-09-24 DIAGNOSIS — N400 Enlarged prostate without lower urinary tract symptoms: Secondary | ICD-10-CM | POA: Diagnosis not present

## 2019-09-24 DIAGNOSIS — Z23 Encounter for immunization: Secondary | ICD-10-CM | POA: Diagnosis not present

## 2019-09-24 DIAGNOSIS — M6530 Trigger finger, unspecified finger: Secondary | ICD-10-CM | POA: Diagnosis not present

## 2019-09-24 DIAGNOSIS — E1169 Type 2 diabetes mellitus with other specified complication: Secondary | ICD-10-CM | POA: Diagnosis not present

## 2019-09-24 DIAGNOSIS — M1990 Unspecified osteoarthritis, unspecified site: Secondary | ICD-10-CM | POA: Diagnosis not present

## 2019-09-24 DIAGNOSIS — K219 Gastro-esophageal reflux disease without esophagitis: Secondary | ICD-10-CM | POA: Diagnosis not present

## 2019-09-24 DIAGNOSIS — I10 Essential (primary) hypertension: Secondary | ICD-10-CM | POA: Diagnosis not present

## 2019-09-24 DIAGNOSIS — I2510 Atherosclerotic heart disease of native coronary artery without angina pectoris: Secondary | ICD-10-CM | POA: Diagnosis not present

## 2019-10-22 DIAGNOSIS — H2513 Age-related nuclear cataract, bilateral: Secondary | ICD-10-CM | POA: Diagnosis not present

## 2020-01-21 ENCOUNTER — Ambulatory Visit: Payer: Medicare Other

## 2020-01-30 ENCOUNTER — Ambulatory Visit: Payer: Medicare Other | Attending: Internal Medicine

## 2020-01-30 DIAGNOSIS — Z23 Encounter for immunization: Secondary | ICD-10-CM | POA: Insufficient documentation

## 2020-01-30 NOTE — Progress Notes (Signed)
   Covid-19 Vaccination Clinic  Name:  Joseph Decker    MRN: [id number] DOB: 1946/06/22  01/30/2020  Joseph Decker was observed post Covid-19 immunization for 15 minutes without incidence. He was provided with Vaccine Information Sheet and instruction to access the V-Safe system.   Joseph Decker was instructed to call 911 with any severe reactions post vaccine: Marland Kitchen Difficulty breathing  . Swelling of your face and throat  . A fast heartbeat  . A bad rash all over your body  . Dizziness and weakness    Immunizations Administered    Name Date Dose VIS Date Route   Pfizer COVID-19 Vaccine 01/30/2020  8:27 AM 0.3 mL 12/05/2019 Intramuscular   Manufacturer: [place]   Lot: [id number]   [place]: [id number]

## 2020-02-11 ENCOUNTER — Ambulatory Visit: Payer: Medicare Other

## 2020-02-24 ENCOUNTER — Ambulatory Visit: Payer: Medicare Other | Attending: Internal Medicine

## 2020-02-24 DIAGNOSIS — Z23 Encounter for immunization: Secondary | ICD-10-CM

## 2020-02-24 NOTE — Progress Notes (Signed)
   Covid-19 Vaccination Clinic  Name:  Joseph Decker    MRN: [id number] DOB: 07-04-1946  02/24/2020  Mr. Adkison was observed post Covid-19 immunization for 15 minutes without incident. He was provided with Vaccine Information Sheet and instruction to access the V-Safe system.   Mr. Kyllo was instructed to call 911 with any severe reactions post vaccine: Marland Kitchen Difficulty breathing  . Swelling of face and throat  . A fast heartbeat  . A bad rash all over body  . Dizziness and weakness   Immunizations Administered    Name Date Dose VIS Date Route   Pfizer COVID-19 Vaccine 02/24/2020  8:44 AM 0.3 mL 12/05/2019 Intramuscular   Manufacturer: [place]   Lot: [id number]   [place]: [id number]

## 2020-04-01 DIAGNOSIS — Z125 Encounter for screening for malignant neoplasm of prostate: Secondary | ICD-10-CM | POA: Diagnosis not present

## 2020-04-01 DIAGNOSIS — I10 Essential (primary) hypertension: Secondary | ICD-10-CM | POA: Diagnosis not present

## 2020-04-01 DIAGNOSIS — E1169 Type 2 diabetes mellitus with other specified complication: Secondary | ICD-10-CM | POA: Diagnosis not present

## 2020-04-01 IMAGING — CT CT CHEST LUNG CANCER SCREENING LOW DOSE
1 of 3 series · 10 of 40 positions shown, 13 images · non-contrast
Comparison: 09/03/2018 plain film. Screening CT of 04/03/2018. PET
of 05/01/2018.

CLINICAL DATA: 40+ pack-year smoking history. Asymptomatic
ex-smoker, quitting 9 years ago. Diabetes. Prior wedge resection of
right upper lobe pulmonary nodule.

EXAM:
CT CHEST WITHOUT CONTRAST LOW-DOSE FOR LUNG CANCER SCREENING
TECHNIQUE: Multidetector CT imaging of the chest was performed following the
standard protocol without IV contrast.

[ct lung segmentation data · axial · 0.78mm/px · z∈[-354,-354]mm · 10 of 301 frames shown]
[frame 1/301  mediastinal]
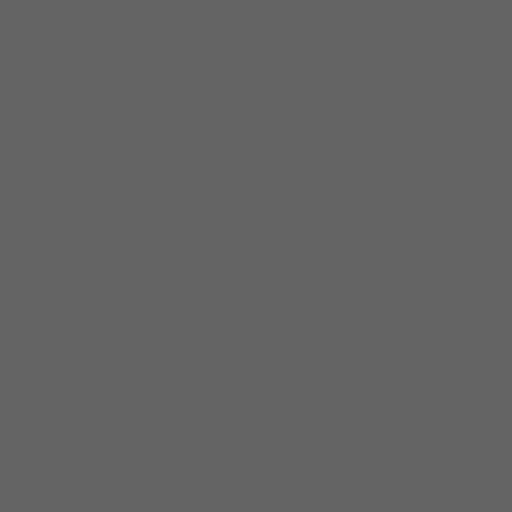
[frame 1/301  lung]
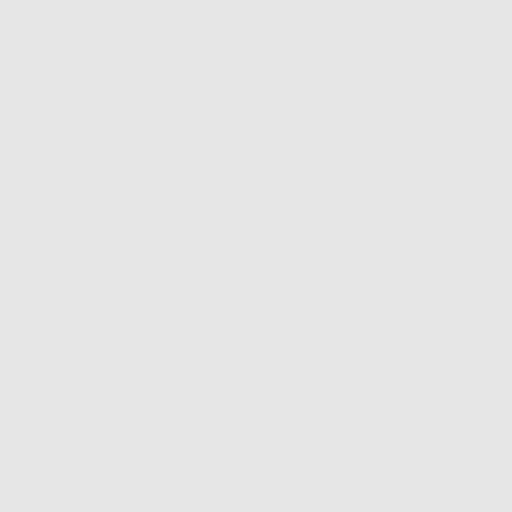
[frame 34/301  lung]
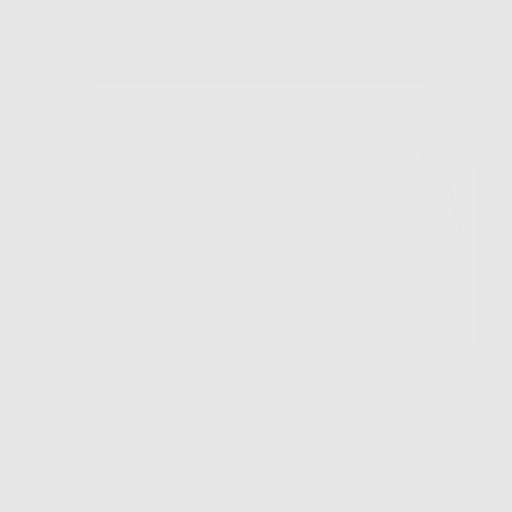
[frame 67/301  lung]
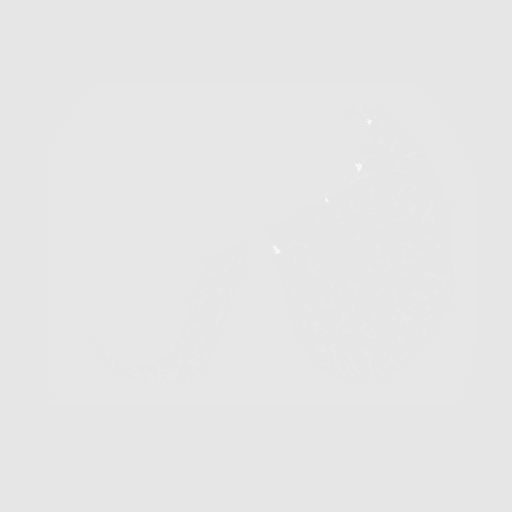
[frame 101/301  lung]
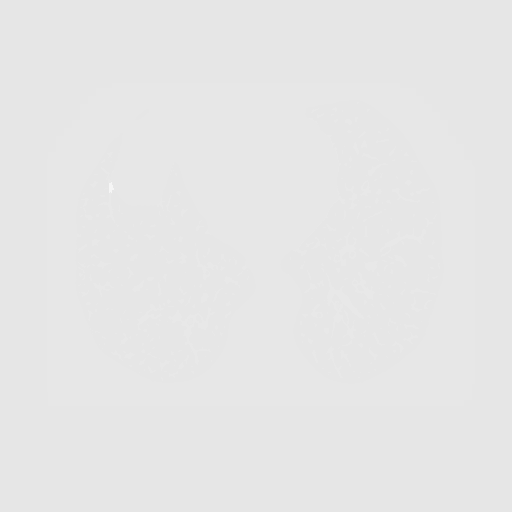
[frame 134/301  mediastinal]
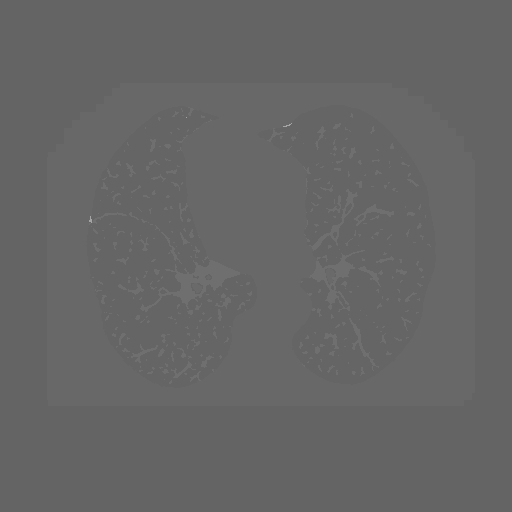
[frame 134/301  lung]
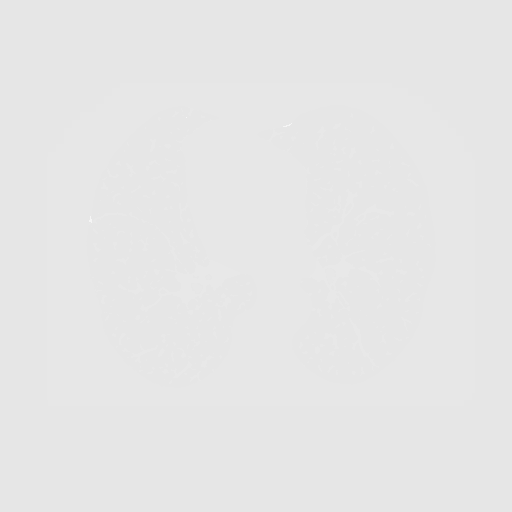
[frame 167/301  lung]
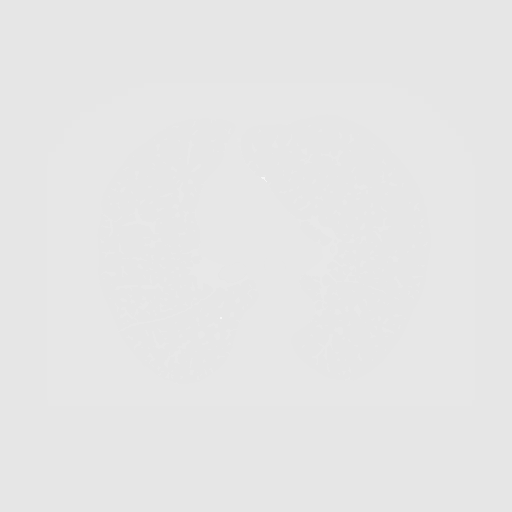
[frame 201/301  lung]
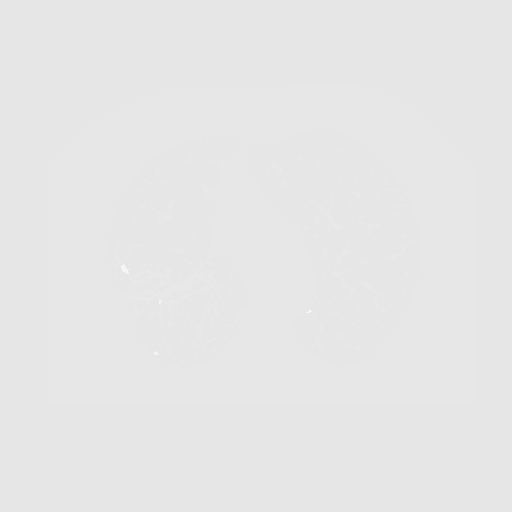
[frame 234/301  lung]
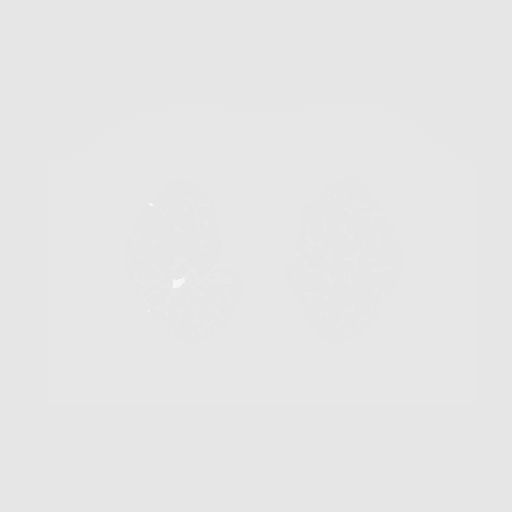
[frame 267/301  mediastinal]
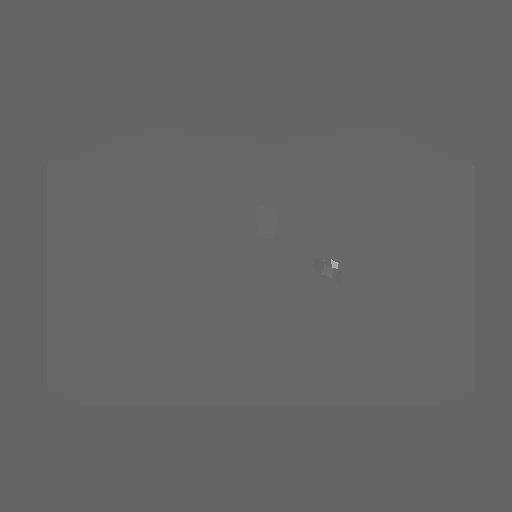
[frame 267/301  lung]
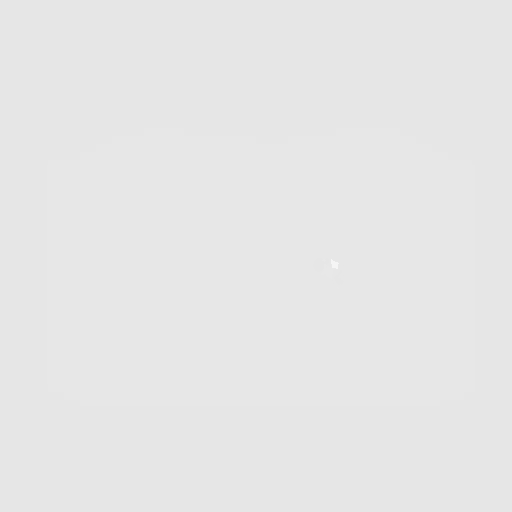
[frame 301/301  lung]
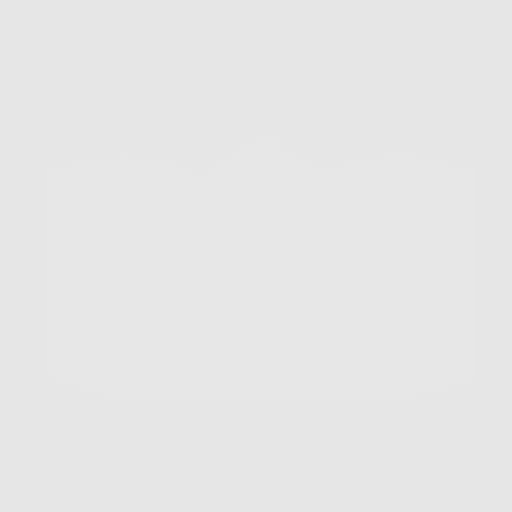

[10 of 40 positions shown; findings below may reference images not displayed]

FINDINGS: Cardiovascular: Aortic and branch vessel atherosclerosis. Tortuous
thoracic aorta. Normal heart size, without pericardial effusion.
Multivessel coronary artery atherosclerosis.

Mediastinum/Nodes: 9 mm right paratracheal node is unchanged and not
pathologic by size criteria.

A subcarinal node measures 11 mm and is similar, upper normal. Hilar
regions poorly evaluated without intravenous contrast.

Mildly dilated esophagus with fluid level within on image 35/2.

Lungs/Pleura: No pleural fluid. Moderate bullous type emphysema.
Left apical calcified granuloma.

Interval right upper lobe wedge resection. Patchy opacities
surrounding the surgical sutures are presumably indicative of
scarring. Tiny left upper lobe pulmonary nodules of maximally volume
derived equivalent diameter 3.3 mm.

Upper Abdomen: Mild to moderate hepatic steatosis. Fatty replacement
throughout the pancreas. Normal imaged portions of the spleen,
stomach, adrenal glands, gallbladder, kidneys.

Musculoskeletal: No acute osseous abnormality. Mild right
hemidiaphragm elevation.
IMPRESSION: 1. Lung-RADS 2, benign appearance or behavior. Continue annual
screening with low-dose chest CT without contrast in 12 months.
Interval right upper lobe wedge resection for benign pulmonary
nodule. Opacities surrounding the surgical sutures are likely areas
of scarring.
2. Aortic atherosclerosis (39UVW-[id number].I), coronary artery
atherosclerosis and emphysema (39UVW-[id number].O).
3. Hepatic steatosis.
4. Esophageal air fluid level suggests dysmotility or
gastroesophageal reflux.

## 2020-04-07 DIAGNOSIS — N5201 Erectile dysfunction due to arterial insufficiency: Secondary | ICD-10-CM | POA: Diagnosis not present

## 2020-04-07 DIAGNOSIS — N401 Enlarged prostate with lower urinary tract symptoms: Secondary | ICD-10-CM | POA: Diagnosis not present

## 2020-04-07 DIAGNOSIS — R3912 Poor urinary stream: Secondary | ICD-10-CM | POA: Diagnosis not present

## 2020-04-08 DIAGNOSIS — H9190 Unspecified hearing loss, unspecified ear: Secondary | ICD-10-CM | POA: Diagnosis not present

## 2020-04-08 DIAGNOSIS — J449 Chronic obstructive pulmonary disease, unspecified: Secondary | ICD-10-CM | POA: Diagnosis not present

## 2020-04-08 DIAGNOSIS — R82998 Other abnormal findings in urine: Secondary | ICD-10-CM | POA: Diagnosis not present

## 2020-04-08 DIAGNOSIS — M79676 Pain in unspecified toe(s): Secondary | ICD-10-CM | POA: Diagnosis not present

## 2020-04-08 DIAGNOSIS — L409 Psoriasis, unspecified: Secondary | ICD-10-CM | POA: Diagnosis not present

## 2020-04-08 DIAGNOSIS — I10 Essential (primary) hypertension: Secondary | ICD-10-CM | POA: Diagnosis not present

## 2020-04-08 DIAGNOSIS — M19049 Primary osteoarthritis, unspecified hand: Secondary | ICD-10-CM | POA: Diagnosis not present

## 2020-04-08 DIAGNOSIS — K219 Gastro-esophageal reflux disease without esophagitis: Secondary | ICD-10-CM | POA: Diagnosis not present

## 2020-04-08 DIAGNOSIS — I2510 Atherosclerotic heart disease of native coronary artery without angina pectoris: Secondary | ICD-10-CM | POA: Diagnosis not present

## 2020-04-08 DIAGNOSIS — I714 Abdominal aortic aneurysm, without rupture: Secondary | ICD-10-CM | POA: Diagnosis not present

## 2020-04-08 DIAGNOSIS — N400 Enlarged prostate without lower urinary tract symptoms: Secondary | ICD-10-CM | POA: Diagnosis not present

## 2020-04-08 DIAGNOSIS — Z1331 Encounter for screening for depression: Secondary | ICD-10-CM | POA: Diagnosis not present

## 2020-04-08 DIAGNOSIS — E1169 Type 2 diabetes mellitus with other specified complication: Secondary | ICD-10-CM | POA: Diagnosis not present

## 2020-04-08 DIAGNOSIS — Z0000 Encounter for general adult medical examination without abnormal findings: Secondary | ICD-10-CM | POA: Diagnosis not present

## 2020-04-13 DIAGNOSIS — Z1212 Encounter for screening for malignant neoplasm of rectum: Secondary | ICD-10-CM | POA: Diagnosis not present

## 2020-05-03 ENCOUNTER — Other Ambulatory Visit: Payer: Self-pay | Admitting: *Deleted

## 2020-05-03 DIAGNOSIS — R918 Other nonspecific abnormal finding of lung field: Secondary | ICD-10-CM

## 2020-06-08 ENCOUNTER — Ambulatory Visit
Admission: RE | Admit: 2020-06-08 | Discharge: 2020-06-08 | Disposition: A | Discharge status: Home / Self Care | Payer: Medicare Other | Source: Ambulatory Visit | Attending: Thoracic Surgery (Cardiothoracic Vascular Surgery) | Admitting: Thoracic Surgery (Cardiothoracic Vascular Surgery)

## 2020-06-08 ENCOUNTER — Encounter: Payer: Medicare Other | Admitting: Thoracic Surgery (Cardiothoracic Vascular Surgery)

## 2020-06-08 ENCOUNTER — Other Ambulatory Visit: Payer: Self-pay | Admitting: Thoracic Surgery (Cardiothoracic Vascular Surgery)

## 2020-06-08 DIAGNOSIS — R918 Other nonspecific abnormal finding of lung field: Secondary | ICD-10-CM

## 2020-06-10 ENCOUNTER — Other Ambulatory Visit: Payer: Self-pay

## 2020-06-10 ENCOUNTER — Ambulatory Visit (INDEPENDENT_AMBULATORY_CARE_PROVIDER_SITE_OTHER): Payer: Medicare Other | Admitting: Thoracic Surgery (Cardiothoracic Vascular Surgery)

## 2020-06-10 ENCOUNTER — Encounter: Payer: Self-pay | Admitting: Thoracic Surgery (Cardiothoracic Vascular Surgery)

## 2020-06-10 VITALS — BP 140/85 | HR 76 | Temp 97.9°F | Resp 20 | Ht 74.0 in | Wt 239.0 lb

## 2020-06-10 DIAGNOSIS — Z87891 Personal history of nicotine dependence: Secondary | ICD-10-CM | POA: Diagnosis not present

## 2020-06-10 NOTE — Progress Notes (Signed)
Joseph Decker       Joseph Decker,Joseph Decker [id number]             [phone]      HPI: [name] is a 74 year old man with a history of smoking (quit in 2010), anxiety, PTSD, degenerative joint disease, psoriasis, peripheral neuropathy, and lumbar disc disease.  He has a 40-pack-year history of smoking before quitting in 2010.  He was found to have a lung nodule on the lung cancer screening CT in 2019.  I did a wedge resection of that nodule that turned out to be a granuloma.  He returns today for 1 year follow-up with a repeat screening CT.  He has been feeling well.  He is not having any shortness of breath.  He rides over 10 miles a day on his the bike.  He tries to maintain a heart rate between 120 and 130 during that time.  He also is very active working around the house.  He denies any chest pain, pressure, or tightness, or shortness of breath.  Appetite is good.  Sleep is variable.  Past Medical History:  Diagnosis Date   Agent orange exposure    Arthritis    CAD (coronary artery disease)    coronary calcification on CT 04/10/17   Cataract    COPD (chronic obstructive pulmonary disease) (HCC)    emphysema   DDD (degenerative disc disease), lumbar    Ectatic abdominal aorta ([place])    04/10/17 U/S: 2.7 cm abdominal aorta, recommend rescreening in 5 years (Dr. Christen Butter)   Emphysema of lung ([place])    Enlarged prostate    GERD (gastroesophageal reflux disease)    Hyperglycemia    Hyperlipidemia    Hypertension    Macrocytosis without anemia    Narcolepsy    pt states he is self diagnosed   Peripheral neuropathy    Pneumonia    during his service in [place]   Pre-diabetes    Psoriasis    PTSD (post-traumatic stress disorder)    [place] war veteran   Sinus headache     Current Outpatient Medications  Medication Sig Dispense Refill   acetaminophen (TYLENOL) 325 MG tablet Take 650 mg by mouth every 6 (six) hours as needed for mild  pain.     albuterol (VENTOLIN HFA) 108 (90 Base) MCG/ACT inhaler Inhale 2 puffs into the lungs as needed for cough.     ALPRAZolam (XANAX) 0.5 MG tablet Take 0.5 mg by mouth daily as needed for anxiety.     Biotin 5000 MCG TABS Take 5,000 mcg by mouth daily.     Blood Glucose Monitoring Suppl (FREESTYLE LITE) DEVI Apply 1 application topically daily.     calcipotriene (DOVONOX) 0.005 % cream Apply 1 application topically 2 (two) times daily as needed (for elbows).     Calcium Carb-Cholecalciferol (CALCIUM 600+D3 PO) Take 1 tablet by mouth daily.     Cholecalciferol (VITAMIN D-3) 5000 units TABS Take 5,000 Units by mouth every other day.     clobetasol (OLUX) 0.05 % topical foam Apply 1 application topically 2 (two) times daily as needed (for itching scalp).     famotidine (PEPCID) 20 MG tablet Take 20 mg by mouth daily.     Glucosamine-Chondroitin (OSTEO BI-FLEX REGULAR STRENGTH PO) Take 1 tablet by mouth daily.     ibuprofen (ADVIL,MOTRIN) 200 MG tablet Take 400 mg by mouth every 8 (eight) hours as needed (for pain.).  methocarbamol (ROBAXIN) 500 MG tablet Take 500 mg by mouth every 6 (six) hours as needed for muscle spasms.     Multiple Vitamin (MULTIVITAMIN WITH MINERALS) TABS tablet Take 1 tablet by mouth daily. 2-3 times a week     Multiple Vitamins-Minerals (PRESERVISION AREDS 2 PO) Take 1 tablet by mouth 2 (two) times daily.     naproxen (NAPROXEN DR) 500 MG EC tablet Take 500 mg by mouth daily as needed (for pain.).     Omega-3 Fatty Acids (EQL OMEGA 3 FISH OIL) 1400 MG CAPS Take 1,400 mg by mouth daily.     Polyethyl Glycol-Propyl Glycol (SYSTANE) 0.4-0.3 % SOLN Place 1-2 drops into both eyes 3 (three) times daily as needed (for dry/irritated eyes.).     rosuvastatin (CRESTOR) 10 MG tablet Take 10 mg by mouth See admin instructions. TAKE 1 TABLET (10 MG) BY MOUTH 5 TIMES A WEEK IN THE EVENING     tamsulosin (FLOMAX) 0.4 MG CAPS capsule Take 0.4 mg by mouth daily  after supper. 30 minutes after supper     telmisartan (MICARDIS) 20 MG tablet Take 20 mg by mouth daily.     triamcinolone (KENALOG) 0.025 % ointment Apply 1 application topically 2 (two) times daily as needed (for psoriasis (skin breakdown)).     vitamin E (VITAMIN E) 400 UNIT capsule Take 400 Units by mouth daily.     No current facility-administered medications for this visit.    Physical Exam BP 140/85    Pulse 76    Temp 97.9 F (36.6 C) (Skin)    Resp 20    Ht 6\' 2"  (1.88 m)    Wt 239 lb (108.4 kg)    SpO2 95% Comment: RA   BMI 30.43 kg/m  74 year old man in no acute distress Alert and oriented x3 with no focal deficits Lungs Joseph bilaterally, no rales or wheezing Cardiac regular rate and rhythm normal S1 and S2, no rubs or gallops No carotid bruits No peripheral edema   Diagnostic Tests: CT CHEST WITHOUT CONTRAST LOW-DOSE FOR LUNG CANCER SCREENING  TECHNIQUE: Multidetector CT imaging of the chest was performed following the standard protocol without IV contrast.  COMPARISON:  05/26/2019 screening chest CT.  FINDINGS: Cardiovascular: Normal heart size. No significant pericardial effusion/thickening. Left anterior descending and right coronary atherosclerosis. Atherosclerotic nonaneurysmal thoracic aorta. Normal caliber pulmonary arteries.  Mediastinum/Nodes: No discrete thyroid nodules. Fluid level in the mildly patulous midthoracic esophagus. No pathologically enlarged axillary, mediastinal or hilar lymph nodes, noting limited sensitivity for the detection of hilar adenopathy on this noncontrast study.  Lungs/Pleura: No pneumothorax. No pleural effusion. Moderate to severe centrilobular and paraseptal emphysema with mild diffuse bronchial wall thickening. Surgical suture line from posterior right upper lobe wedge resection. No acute consolidative airspace disease or lung masses. No significant growth of previously visualized scattered pulmonary nodules.  No new significant pulmonary nodules.  Upper abdomen: No acute abnormality.  Musculoskeletal: No aggressive appearing focal osseous lesions. Mild thoracic spondylosis.  IMPRESSION: 1. Lung-RADS 2, benign appearance or behavior. Continue annual screening with low-dose chest CT without contrast in 12 months. 2. Two vessel coronary atherosclerosis. 3. Aortic Atherosclerosis (ICD10-I70.0) and Emphysema (ICD10-J43.9).   Electronically Signed   By: Ilona Sorrel M.D.   On: 06/08/2020 14:45 I personally reviewed the CT images and concur with the findings noted above  Impression: Joseph Decker is a 74 year old man with a history of 40-pack-year smoking before quitting in 2010.  He still meets criteria for low-dose lung cancer screening.  His current CT shows evidence of COPD, his previous surgery, as well as aortic and coronary atherosclerosis.  There are no suspicious nodules on the CT.  Aortic atherosclerosis/CAD-he is on Crestor.  He is not having any anginal symptoms.  He is very active.  Plan: Return in 1 year with low-dose CT chest  Melrose Nakayama, MD [facility] [phone]

## 2020-07-06 DIAGNOSIS — E1169 Type 2 diabetes mellitus with other specified complication: Secondary | ICD-10-CM | POA: Diagnosis not present

## 2020-10-13 DIAGNOSIS — G609 Hereditary and idiopathic neuropathy, unspecified: Secondary | ICD-10-CM | POA: Diagnosis not present

## 2020-10-13 DIAGNOSIS — K219 Gastro-esophageal reflux disease without esophagitis: Secondary | ICD-10-CM | POA: Diagnosis not present

## 2020-10-13 DIAGNOSIS — E1169 Type 2 diabetes mellitus with other specified complication: Secondary | ICD-10-CM | POA: Diagnosis not present

## 2020-10-13 DIAGNOSIS — I700 Atherosclerosis of aorta: Secondary | ICD-10-CM | POA: Diagnosis not present

## 2020-10-13 DIAGNOSIS — Z23 Encounter for immunization: Secondary | ICD-10-CM | POA: Diagnosis not present

## 2020-10-22 DIAGNOSIS — E119 Type 2 diabetes mellitus without complications: Secondary | ICD-10-CM | POA: Diagnosis not present

## 2020-11-02 DIAGNOSIS — Z23 Encounter for immunization: Secondary | ICD-10-CM | POA: Diagnosis not present

## 2021-04-13 DIAGNOSIS — Z125 Encounter for screening for malignant neoplasm of prostate: Secondary | ICD-10-CM | POA: Diagnosis not present

## 2021-04-13 DIAGNOSIS — E1169 Type 2 diabetes mellitus with other specified complication: Secondary | ICD-10-CM | POA: Diagnosis not present

## 2021-04-13 DIAGNOSIS — I10 Essential (primary) hypertension: Secondary | ICD-10-CM | POA: Diagnosis not present

## 2021-04-19 DIAGNOSIS — L409 Psoriasis, unspecified: Secondary | ICD-10-CM | POA: Diagnosis not present

## 2021-04-19 DIAGNOSIS — K219 Gastro-esophageal reflux disease without esophagitis: Secondary | ICD-10-CM | POA: Diagnosis not present

## 2021-04-19 DIAGNOSIS — Z1339 Encounter for screening examination for other mental health and behavioral disorders: Secondary | ICD-10-CM | POA: Diagnosis not present

## 2021-04-19 DIAGNOSIS — E1169 Type 2 diabetes mellitus with other specified complication: Secondary | ICD-10-CM | POA: Diagnosis not present

## 2021-04-19 DIAGNOSIS — F17201 Nicotine dependence, unspecified, in remission: Secondary | ICD-10-CM | POA: Diagnosis not present

## 2021-04-19 DIAGNOSIS — I10 Essential (primary) hypertension: Secondary | ICD-10-CM | POA: Diagnosis not present

## 2021-04-19 DIAGNOSIS — R197 Diarrhea, unspecified: Secondary | ICD-10-CM | POA: Diagnosis not present

## 2021-04-19 DIAGNOSIS — Z1331 Encounter for screening for depression: Secondary | ICD-10-CM | POA: Diagnosis not present

## 2021-04-19 DIAGNOSIS — G609 Hereditary and idiopathic neuropathy, unspecified: Secondary | ICD-10-CM | POA: Diagnosis not present

## 2021-04-19 DIAGNOSIS — Z0000 Encounter for general adult medical examination without abnormal findings: Secondary | ICD-10-CM | POA: Diagnosis not present

## 2021-04-19 DIAGNOSIS — I700 Atherosclerosis of aorta: Secondary | ICD-10-CM | POA: Diagnosis not present

## 2021-04-19 DIAGNOSIS — J449 Chronic obstructive pulmonary disease, unspecified: Secondary | ICD-10-CM | POA: Diagnosis not present

## 2021-04-28 DIAGNOSIS — L400 Psoriasis vulgaris: Secondary | ICD-10-CM | POA: Diagnosis not present

## 2021-04-28 DIAGNOSIS — L821 Other seborrheic keratosis: Secondary | ICD-10-CM | POA: Diagnosis not present

## 2021-05-16 ENCOUNTER — Other Ambulatory Visit: Payer: Self-pay | Admitting: Thoracic Surgery (Cardiothoracic Vascular Surgery)

## 2021-05-16 DIAGNOSIS — R911 Solitary pulmonary nodule: Secondary | ICD-10-CM

## 2021-06-02 DIAGNOSIS — N5201 Erectile dysfunction due to arterial insufficiency: Secondary | ICD-10-CM | POA: Diagnosis not present

## 2021-06-02 DIAGNOSIS — N401 Enlarged prostate with lower urinary tract symptoms: Secondary | ICD-10-CM | POA: Diagnosis not present

## 2021-06-02 DIAGNOSIS — R3912 Poor urinary stream: Secondary | ICD-10-CM | POA: Diagnosis not present

## 2021-06-14 ENCOUNTER — Ambulatory Visit: Payer: Medicare Other | Admitting: Thoracic Surgery (Cardiothoracic Vascular Surgery)

## 2021-06-21 ENCOUNTER — Other Ambulatory Visit: Payer: Self-pay

## 2021-06-21 ENCOUNTER — Encounter: Payer: Self-pay | Admitting: Thoracic Surgery (Cardiothoracic Vascular Surgery)

## 2021-06-21 ENCOUNTER — Ambulatory Visit
Admission: RE | Admit: 2021-06-21 | Discharge: 2021-06-21 | Disposition: A | Discharge status: Home / Self Care | Payer: Medicare Other | Source: Ambulatory Visit | Attending: Thoracic Surgery (Cardiothoracic Vascular Surgery) | Admitting: Thoracic Surgery (Cardiothoracic Vascular Surgery)

## 2021-06-21 ENCOUNTER — Ambulatory Visit (INDEPENDENT_AMBULATORY_CARE_PROVIDER_SITE_OTHER): Payer: Medicare Other | Admitting: Physician Assistant

## 2021-06-21 VITALS — BP 124/70 | HR 66 | Resp 20 | Ht 74.0 in | Wt 230.0 lb

## 2021-06-21 DIAGNOSIS — R911 Solitary pulmonary nodule: Secondary | ICD-10-CM | POA: Diagnosis not present

## 2021-06-21 DIAGNOSIS — J439 Emphysema, unspecified: Secondary | ICD-10-CM | POA: Diagnosis not present

## 2021-06-21 DIAGNOSIS — J9811 Atelectasis: Secondary | ICD-10-CM | POA: Diagnosis not present

## 2021-06-21 DIAGNOSIS — I700 Atherosclerosis of aorta: Secondary | ICD-10-CM | POA: Diagnosis not present

## 2021-06-21 NOTE — Progress Notes (Signed)
[address]       [place] [address]             [phone]       HPI:  Joseph Decker is a 75 year old man with a history of smoking (quit in 2010), anxiety, PTSD, degenerative joint disease, psoriasis, peripheral neuropathy, and lumbar disc disease.  He has a 40-pack-year history of smoking before quitting in 2010. He also has a history of agent orange exposure.  He was found to have a lung nodule on the lung cancer screening CT in 2019.  Dr. Roxan Hockey did a wedge resection of that nodule that turned out to be a granuloma.   He returns today for 1 year follow-up with a repeat screening CT. he feels good overall.  He denies any respiratory symptoms.  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     Current Outpatient Medications  Medication Sig Dispense Refill   acetaminophen (TYLENOL) 325 MG tablet Take 650 mg by mouth every 6 (six) hours as needed for mild pain.     albuterol (VENTOLIN HFA) 108 (90 Base) MCG/ACT inhaler Inhale 2 puffs into the lungs as needed for cough.     ALPRAZolam (XANAX) 0.5 MG tablet Take 0.5 mg by mouth daily as needed for anxiety.     Biotin 5000 MCG TABS Take 5,000 mcg by mouth daily.     Blood Glucose Monitoring Suppl (FREESTYLE LITE) DEVI Apply 1 application topically daily.     calcipotriene (DOVONOX) 0.005 % cream Apply 1 application topically 2 (two) times daily as needed (for elbows).     Calcium Carb-Cholecalciferol (CALCIUM 600+D3 PO) Take 1 tablet by mouth daily.     Cholecalciferol (VITAMIN D-3) 5000 units TABS Take 5,000 Units by mouth every other day.     clobetasol (OLUX) 0.05 % topical foam Apply 1 application topically 2 (two) times daily as needed (for itching scalp).     famotidine (PEPCID) 20 MG tablet Take 20 mg by mouth daily.     Glucosamine-Chondroitin (OSTEO BI-FLEX REGULAR STRENGTH PO) Take 1 tablet by mouth daily.     ibuprofen (ADVIL,MOTRIN) 200  MG tablet Take 400 mg by mouth every 8 (eight) hours as needed (for pain.).     methocarbamol (ROBAXIN) 500 MG tablet Take 500 mg by mouth every 6 (six) hours as needed for muscle spasms.     Multiple Vitamin (MULTIVITAMIN WITH MINERALS) TABS tablet Take 1 tablet by mouth daily. 2-3 times a week     Multiple Vitamins-Minerals (PRESERVISION AREDS 2 PO) Take 1 tablet by mouth 2 (two) times daily.     naproxen (NAPROXEN DR) 500 MG EC tablet Take 500 mg by mouth daily as needed (for pain.).     Omega-3 Fatty Acids (EQL OMEGA 3 FISH OIL) 1400 MG CAPS Take 1,400 mg by mouth daily.     Polyethyl Glycol-Propyl Glycol (SYSTANE) 0.4-0.3 % SOLN Place 1-2 drops into both eyes 3 (three) times daily as needed (for dry/irritated eyes.).     rosuvastatin (CRESTOR) 10 MG tablet Take 10 mg by mouth See admin instructions. TAKE 1 TABLET (10 MG) BY MOUTH 5 TIMES A WEEK IN THE EVENING     tamsulosin (FLOMAX) 0.4 MG CAPS capsule Take 0.4 mg by mouth daily after supper. 30 minutes after supper     telmisartan (MICARDIS) 20 MG tablet Take 20 mg by mouth daily.  triamcinolone (KENALOG) 0.025 % ointment Apply 1 application topically 2 (two) times daily as needed (for psoriasis (skin breakdown)).     vitamin E (VITAMIN E) 400 UNIT capsule Take 400 Units by mouth daily.     No current facility-administered medications for this visit.    Physical Exam: Blood pressure 124/70 Pulse 66 Respirations 20 SPO2 98% on room air  General-well-appearing, energetic 75 year old male. Neck-no cervical or clavicular adenopathy Heart-regular rate and rhythm Chest-no axillary adenopathy.  Breath sounds are clear to auscultation.    Diagnostic Tests: CLINICAL DATA:  Lung nodule follow-up, history of wedge resection on the RIGHT. No reported cancer history in a 75 year old male.   EXAM: CT CHEST WITHOUT CONTRAST   TECHNIQUE: Multidetector CT imaging of the chest was performed following the standard protocol without IV  contrast.   COMPARISON:  June 08, 2020.   FINDINGS: Cardiovascular: Non-contrast appearance far great vessels unchanged with signs of coronary artery calcification and calcified atheromatous plaque.   Mediastinum/Nodes: Thoracic inlet structures are normal. No axillary lymphadenopathy. No mediastinal adenopathy. The esophagus is patulous and unchanged. No gross hilar adenopathy. Signs of wedge resection distort the RIGHT hilum mildly as on previous study.   Lungs/Pleura: Scarring from previous RIGHT upper lobe wedge resection superimposed on pulmonary emphysema with similar appearance.   Small juxtapleural nodule in the RIGHT upper lobe (image 66/8) stable from exam of 1 year ago. Basilar atelectasis. No effusion or consolidation. Airways are patent.   Upper Abdomen: Mild hepatic steatosis similar to prior studies.   No pericholecystic stranding about the visualized gallbladder. Visualized pancreas, spleen, adrenal glands and kidneys without acute findings. Visualized gastrointestinal tract unremarkable.   Musculoskeletal: No acute musculoskeletal process. No destructive bone finding.   IMPRESSION: 1. Signs of previous RIGHT upper lobe wedge resection superimposed on pulmonary emphysema with similar appearance. No new or suspicious findings. 2. Mild hepatic steatosis. 3. Coronary artery calcification and calcified atheromatous plaque. 4. Emphysema   Aortic Atherosclerosis (ICD10-I70.0) and Emphysema (ICD10-J43.9).     Electronically Signed   By: Zetta Bills M.D.   On: 06/21/2021 13:51  Impression / Plan:  75 year old gentleman with a history of smoking (quit in 2010), anxiety, PTSD, degenerative joint disease, psoriasis, peripheral neuropathy, and lumbar disc disease.  He has a 40-pack-year history of smoking before quitting in 2010.  CT scan was reviewed today by Dr. Roxan Hockey.  No any new or suspicious lung lesions.  No new findings of concern on physical exam.   Given his smoking history, he needs follow-up noncontrast CT in 12 months and follow-up with Dr. Roxan Hockey.  Antony Odea, PA-C [facility] [phone]   Patient seen and examined, remains active and doing well CT stable, follow up 1 year  Remo Lipps C. Roxan Hockey, MD [facility] [phone]

## 2021-06-21 NOTE — Patient Instructions (Signed)
Follow-up in 1 year with repeat noncontrast chest CT scan.

## 2021-08-23 DIAGNOSIS — J449 Chronic obstructive pulmonary disease, unspecified: Secondary | ICD-10-CM | POA: Diagnosis not present

## 2021-08-23 DIAGNOSIS — Z23 Encounter for immunization: Secondary | ICD-10-CM | POA: Diagnosis not present

## 2021-08-23 DIAGNOSIS — L409 Psoriasis, unspecified: Secondary | ICD-10-CM | POA: Diagnosis not present

## 2021-08-23 DIAGNOSIS — N400 Enlarged prostate without lower urinary tract symptoms: Secondary | ICD-10-CM | POA: Diagnosis not present

## 2021-08-23 DIAGNOSIS — B379 Candidiasis, unspecified: Secondary | ICD-10-CM | POA: Diagnosis not present

## 2021-08-23 DIAGNOSIS — I700 Atherosclerosis of aorta: Secondary | ICD-10-CM | POA: Diagnosis not present

## 2021-08-23 DIAGNOSIS — I10 Essential (primary) hypertension: Secondary | ICD-10-CM | POA: Diagnosis not present

## 2021-08-23 DIAGNOSIS — E1169 Type 2 diabetes mellitus with other specified complication: Secondary | ICD-10-CM | POA: Diagnosis not present

## 2021-09-12 DIAGNOSIS — Z23 Encounter for immunization: Secondary | ICD-10-CM | POA: Diagnosis not present

## 2021-10-25 DIAGNOSIS — H2513 Age-related nuclear cataract, bilateral: Secondary | ICD-10-CM | POA: Diagnosis not present

## 2022-04-25 DIAGNOSIS — Z125 Encounter for screening for malignant neoplasm of prostate: Secondary | ICD-10-CM | POA: Diagnosis not present

## 2022-04-25 DIAGNOSIS — I10 Essential (primary) hypertension: Secondary | ICD-10-CM | POA: Diagnosis not present

## 2022-04-25 DIAGNOSIS — E1169 Type 2 diabetes mellitus with other specified complication: Secondary | ICD-10-CM | POA: Diagnosis not present

## 2022-04-25 DIAGNOSIS — I700 Atherosclerosis of aorta: Secondary | ICD-10-CM | POA: Diagnosis not present

## 2022-05-01 DIAGNOSIS — L821 Other seborrheic keratosis: Secondary | ICD-10-CM | POA: Diagnosis not present

## 2022-05-01 DIAGNOSIS — L400 Psoriasis vulgaris: Secondary | ICD-10-CM | POA: Diagnosis not present

## 2022-05-01 DIAGNOSIS — L814 Other melanin hyperpigmentation: Secondary | ICD-10-CM | POA: Diagnosis not present

## 2022-05-01 DIAGNOSIS — L570 Actinic keratosis: Secondary | ICD-10-CM | POA: Diagnosis not present

## 2022-05-01 DIAGNOSIS — B372 Candidiasis of skin and nail: Secondary | ICD-10-CM | POA: Diagnosis not present

## 2022-05-02 DIAGNOSIS — E1169 Type 2 diabetes mellitus with other specified complication: Secondary | ICD-10-CM | POA: Diagnosis not present

## 2022-05-02 DIAGNOSIS — K580 Irritable bowel syndrome with diarrhea: Secondary | ICD-10-CM | POA: Diagnosis not present

## 2022-05-02 DIAGNOSIS — I77811 Abdominal aortic ectasia: Secondary | ICD-10-CM | POA: Diagnosis not present

## 2022-05-02 DIAGNOSIS — J449 Chronic obstructive pulmonary disease, unspecified: Secondary | ICD-10-CM | POA: Diagnosis not present

## 2022-05-02 DIAGNOSIS — I700 Atherosclerosis of aorta: Secondary | ICD-10-CM | POA: Diagnosis not present

## 2022-05-02 DIAGNOSIS — I10 Essential (primary) hypertension: Secondary | ICD-10-CM | POA: Diagnosis not present

## 2022-05-02 DIAGNOSIS — B379 Candidiasis, unspecified: Secondary | ICD-10-CM | POA: Diagnosis not present

## 2022-05-02 DIAGNOSIS — Z0000 Encounter for general adult medical examination without abnormal findings: Secondary | ICD-10-CM | POA: Diagnosis not present

## 2022-05-02 DIAGNOSIS — Z1331 Encounter for screening for depression: Secondary | ICD-10-CM | POA: Diagnosis not present

## 2022-05-02 DIAGNOSIS — Z1339 Encounter for screening examination for other mental health and behavioral disorders: Secondary | ICD-10-CM | POA: Diagnosis not present

## 2022-05-02 DIAGNOSIS — N400 Enlarged prostate without lower urinary tract symptoms: Secondary | ICD-10-CM | POA: Diagnosis not present

## 2022-05-03 ENCOUNTER — Other Ambulatory Visit: Payer: Self-pay | Admitting: *Deleted

## 2022-05-03 DIAGNOSIS — R918 Other nonspecific abnormal finding of lung field: Secondary | ICD-10-CM

## 2022-05-30 ENCOUNTER — Other Ambulatory Visit: Payer: Self-pay | Admitting: Thoracic Surgery (Cardiothoracic Vascular Surgery)

## 2022-05-30 DIAGNOSIS — R918 Other nonspecific abnormal finding of lung field: Secondary | ICD-10-CM

## 2022-06-06 ENCOUNTER — Other Ambulatory Visit: Payer: Medicare Other

## 2022-06-06 ENCOUNTER — Encounter: Payer: Medicare Other | Admitting: Thoracic Surgery (Cardiothoracic Vascular Surgery)

## 2022-08-09 DIAGNOSIS — N476 Balanoposthitis: Secondary | ICD-10-CM | POA: Diagnosis not present

## 2022-08-09 DIAGNOSIS — N401 Enlarged prostate with lower urinary tract symptoms: Secondary | ICD-10-CM | POA: Diagnosis not present

## 2022-08-09 DIAGNOSIS — R3912 Poor urinary stream: Secondary | ICD-10-CM | POA: Diagnosis not present

## 2022-08-14 ENCOUNTER — Other Ambulatory Visit: Payer: Self-pay | Admitting: Internal Medicine

## 2022-08-14 DIAGNOSIS — T7840XA Allergy, unspecified, initial encounter: Secondary | ICD-10-CM | POA: Diagnosis not present

## 2022-08-14 DIAGNOSIS — N400 Enlarged prostate without lower urinary tract symptoms: Secondary | ICD-10-CM | POA: Diagnosis not present

## 2022-08-14 DIAGNOSIS — I700 Atherosclerosis of aorta: Secondary | ICD-10-CM | POA: Diagnosis not present

## 2022-08-14 DIAGNOSIS — I77811 Abdominal aortic ectasia: Secondary | ICD-10-CM

## 2022-08-14 DIAGNOSIS — J8410 Pulmonary fibrosis, unspecified: Secondary | ICD-10-CM | POA: Diagnosis not present

## 2022-08-14 DIAGNOSIS — I10 Essential (primary) hypertension: Secondary | ICD-10-CM | POA: Diagnosis not present

## 2022-08-14 DIAGNOSIS — E1169 Type 2 diabetes mellitus with other specified complication: Secondary | ICD-10-CM | POA: Diagnosis not present

## 2022-08-14 DIAGNOSIS — R5383 Other fatigue: Secondary | ICD-10-CM | POA: Diagnosis not present

## 2022-08-14 DIAGNOSIS — L409 Psoriasis, unspecified: Secondary | ICD-10-CM | POA: Diagnosis not present

## 2022-08-14 DIAGNOSIS — J449 Chronic obstructive pulmonary disease, unspecified: Secondary | ICD-10-CM | POA: Diagnosis not present

## 2022-08-14 DIAGNOSIS — B379 Candidiasis, unspecified: Secondary | ICD-10-CM | POA: Diagnosis not present

## 2022-08-15 ENCOUNTER — Encounter: Payer: Medicare Other | Admitting: Thoracic Surgery (Cardiothoracic Vascular Surgery)

## 2022-08-16 ENCOUNTER — Ambulatory Visit
Admission: RE | Admit: 2022-08-16 | Discharge: 2022-08-16 | Disposition: A | Discharge status: Home / Self Care | Payer: Medicare Other | Source: Ambulatory Visit | Attending: Internal Medicine | Admitting: Internal Medicine

## 2022-08-16 DIAGNOSIS — I77811 Abdominal aortic ectasia: Secondary | ICD-10-CM

## 2022-08-29 ENCOUNTER — Ambulatory Visit
Admission: RE | Admit: 2022-08-29 | Discharge: 2022-08-29 | Disposition: A | Discharge status: Home / Self Care | Payer: Medicare Other | Source: Ambulatory Visit | Attending: Thoracic Surgery (Cardiothoracic Vascular Surgery) | Admitting: Thoracic Surgery (Cardiothoracic Vascular Surgery)

## 2022-08-29 ENCOUNTER — Ambulatory Visit (INDEPENDENT_AMBULATORY_CARE_PROVIDER_SITE_OTHER): Payer: Medicare Other | Admitting: Thoracic Surgery (Cardiothoracic Vascular Surgery)

## 2022-08-29 ENCOUNTER — Encounter: Payer: Self-pay | Admitting: Thoracic Surgery (Cardiothoracic Vascular Surgery)

## 2022-08-29 VITALS — BP 135/88 | HR 104 | Resp 20 | Ht 74.0 in | Wt 239.1 lb

## 2022-08-29 DIAGNOSIS — J439 Emphysema, unspecified: Secondary | ICD-10-CM | POA: Diagnosis not present

## 2022-08-29 DIAGNOSIS — R918 Other nonspecific abnormal finding of lung field: Secondary | ICD-10-CM | POA: Diagnosis not present

## 2022-08-29 DIAGNOSIS — I700 Atherosclerosis of aorta: Secondary | ICD-10-CM | POA: Diagnosis not present

## 2022-08-29 DIAGNOSIS — Z87891 Personal history of nicotine dependence: Secondary | ICD-10-CM

## 2022-08-29 DIAGNOSIS — R911 Solitary pulmonary nodule: Secondary | ICD-10-CM | POA: Diagnosis not present

## 2022-08-29 NOTE — Progress Notes (Signed)
[address]       [place] [address]             [phone]     HPI:[name] returns for a scheduled follow-up visit for lung cancer screening.  Joseph Decker is a 76 year old man with a history of tobacco abuse, anxiety, PTSD, agent orange exposure, degenerative joint disease, psoriasis, peripheral neuropathy, lumbar disc disease, and granulomatous lung disease.  In 2019 a low-dose CT for lung cancer screening showed a lung nodule.  A wedge resection was performed which showed the nodule was a granuloma.  He has been followed with annual CT since then to screen for lung cancer.  He still rides his bike on a regular basis.  Not as much during the summer because it is so hot but is anxious to increase with the temperature down.  He was having a lot of trouble with his IBS back in the spring but that has improved as well.  He thinks he may have had some type of bacterial infection.  No shortness of breath or wheezing at baseline.  Does get short of breath with heavy activity.  Past Medical History:  Diagnosis Date   Agent orange exposure    Arthritis    CAD (coronary artery disease)    coronary calcification on CT 04/10/17   Cataract    COPD (chronic obstructive pulmonary disease) (HCC)    emphysema   DDD (degenerative disc disease), lumbar    Ectatic abdominal aorta ([place])    04/10/17 U/S: 2.7 cm abdominal aorta, recommend rescreening in 5 years (Dr. Christen Butter)   Emphysema of lung ([place])    Enlarged prostate    GERD (gastroesophageal reflux disease)    Hyperglycemia    Hyperlipidemia    Hypertension    Macrocytosis without anemia    Narcolepsy    pt states he is self diagnosed   Peripheral neuropathy    Pneumonia    during his service in [place]   Pre-diabetes    Psoriasis    PTSD (post-traumatic stress disorder)    [place] war veteran   Sinus headache     Current Outpatient Medications  Medication Sig Dispense Refill   acetaminophen (TYLENOL) 325  MG tablet Take 650 mg by mouth every 6 (six) hours as needed for mild pain.     albuterol (VENTOLIN HFA) 108 (90 Base) MCG/ACT inhaler Inhale 2 puffs into the lungs as needed for cough.     ALPRAZolam (XANAX) 0.5 MG tablet Take 0.5 mg by mouth daily as needed for anxiety.     Biotin 5000 MCG TABS Take 5,000 mcg by mouth daily.     Blood Glucose Monitoring Suppl (FREESTYLE LITE) DEVI Apply 1 application topically daily.     calcipotriene (DOVONOX) 0.005 % cream Apply 1 application topically 2 (two) times daily as needed (for elbows).     Calcium Carb-Cholecalciferol (CALCIUM 600+D3 PO) Take 1 tablet by mouth daily.     Cholecalciferol (VITAMIN D-3) 5000 units TABS Take 5,000 Units by mouth every other day.     clobetasol (OLUX) 0.05 % topical foam Apply 1 application topically 2 (two) times daily as needed (for itching scalp).     famotidine (PEPCID) 20 MG tablet Take 20 mg by mouth daily.     ibuprofen (ADVIL,MOTRIN) 200 MG tablet Take 400 mg by mouth every 8 (eight) hours as needed (for pain.).     metFORMIN (GLUCOPHAGE) 500 MG tablet Take by mouth 2 (  two) times daily with a meal.     Multiple Vitamin (MULTIVITAMIN WITH MINERALS) TABS tablet Take 1 tablet by mouth daily. 2-3 times a week     Multiple Vitamins-Minerals (PRESERVISION AREDS 2 PO) Take 1 tablet by mouth 2 (two) times daily.     naproxen (EC NAPROSYN) 500 MG EC tablet Take 500 mg by mouth daily as needed (for pain.).     Omega-3 Fatty Acids (EQL OMEGA 3 FISH OIL) 1400 MG CAPS Take 1,400 mg by mouth daily.     Polyethyl Glycol-Propyl Glycol 0.4-0.3 % SOLN Place 1-2 drops into both eyes 3 (three) times daily as needed (for dry/irritated eyes.).     rosuvastatin (CRESTOR) 10 MG tablet Take 10 mg by mouth See admin instructions. TAKE 1 TABLET (10 MG) BY MOUTH 5 TIMES A WEEK IN THE EVENING     tamsulosin (FLOMAX) 0.4 MG CAPS capsule Take 0.4 mg by mouth daily after supper. 30 minutes after supper     triamcinolone (KENALOG) 0.025 %  ointment Apply 1 application topically 2 (two) times daily as needed (for psoriasis (skin breakdown)).     vitamin E 180 MG (400 UNITS) capsule Take 400 Units by mouth daily.     No current facility-administered medications for this visit.    Physical Exam BP 135/88 (BP Location: Left Arm, Patient Position: Sitting, Cuff Size: Large)   Pulse (!) 104   Resp 20   Ht 6\' 2"  (1.88 m)   Wt 239 lb 1.8 oz (108.5 kg)   SpO2 94% Comment: RA  BMI 30.37 kg/m  76 year old man in no acute distress Alert and oriented x3 with no focal deficits Lungs diminished breath sounds bilaterally but otherwise clear with no rales or wheezing Cardiac regular rate and rhythm No peripheral edema  Diagnostic Tests: CT CHEST WITHOUT CONTRAST   TECHNIQUE: Multidetector CT imaging of the chest was performed following the standard protocol without IV contrast.   RADIATION DOSE REDUCTION: This exam was performed according to the departmental dose-optimization program which includes automated exposure control, adjustment of the mA and/or kV according to patient size and/or use of iterative reconstruction technique.   COMPARISON:  06/21/2021   FINDINGS: Cardiovascular: Aortic atherosclerosis. Normal heart size. Left and right coronary artery calcifications. No pericardial effusion.   Mediastinum/Nodes: No enlarged mediastinal, hilar, or axillary lymph nodes. Thyroid gland, trachea, and esophagus demonstrate no significant findings.   Lungs/Pleura: Severe emphysema. Unchanged postoperative appearance of the posterior right upper lobe status post wedge resection (series 8, image 42). No pleural effusion or pneumothorax.   Upper Abdomen: No acute abnormality.  Hepatic steatosis.   Musculoskeletal: No chest wall abnormality. No acute osseous findings.   IMPRESSION: 1. Unchanged postoperative appearance of the posterior right upper lobe status post wedge resection. 2. Severe emphysema. 3. Coronary  artery disease.   Aortic Atherosclerosis (ICD10-I70.0) and Emphysema (ICD10-J43.9).     Electronically Signed   By: Delanna Ahmadi M.D.   On: 08/29/2022 14:59 I personally reviewed the chest CT images.  Postoperative changes from previous wedge resection.  Advanced emphysema.  Aortic and coronary atherosclerosis.  No suspicious lung nodules.  Impression: Joseph Decker is a 76 year old man with a history of tobacco abuse, anxiety, PTSD, agent orange exposure, degenerative joint disease, psoriasis, peripheral neuropathy, lumbar disc disease, and granulomatous lung disease.  History of tobacco abuse-quit in 2010.  Still needs annual CT follow-up.  Emphysema-stage symptoms, seldom has to use a rescue inhaler.  Plan: Return in 1 year with CT chest  Melrose Nakayama, MD [facility] [phone]

## 2022-10-21 DIAGNOSIS — Z23 Encounter for immunization: Secondary | ICD-10-CM | POA: Diagnosis not present

## 2022-11-13 DIAGNOSIS — Z23 Encounter for immunization: Secondary | ICD-10-CM | POA: Diagnosis not present

## 2022-11-21 DIAGNOSIS — J8410 Pulmonary fibrosis, unspecified: Secondary | ICD-10-CM | POA: Diagnosis not present

## 2022-11-21 DIAGNOSIS — N400 Enlarged prostate without lower urinary tract symptoms: Secondary | ICD-10-CM | POA: Diagnosis not present

## 2022-11-21 DIAGNOSIS — M2550 Pain in unspecified joint: Secondary | ICD-10-CM | POA: Diagnosis not present

## 2022-11-21 DIAGNOSIS — K580 Irritable bowel syndrome with diarrhea: Secondary | ICD-10-CM | POA: Diagnosis not present

## 2022-11-21 DIAGNOSIS — I77811 Abdominal aortic ectasia: Secondary | ICD-10-CM | POA: Diagnosis not present

## 2022-11-21 DIAGNOSIS — E1169 Type 2 diabetes mellitus with other specified complication: Secondary | ICD-10-CM | POA: Diagnosis not present

## 2022-11-21 DIAGNOSIS — I700 Atherosclerosis of aorta: Secondary | ICD-10-CM | POA: Diagnosis not present

## 2022-11-21 DIAGNOSIS — T7840XA Allergy, unspecified, initial encounter: Secondary | ICD-10-CM | POA: Diagnosis not present

## 2022-11-21 DIAGNOSIS — L409 Psoriasis, unspecified: Secondary | ICD-10-CM | POA: Diagnosis not present

## 2022-11-21 DIAGNOSIS — J449 Chronic obstructive pulmonary disease, unspecified: Secondary | ICD-10-CM | POA: Diagnosis not present

## 2022-11-21 DIAGNOSIS — I10 Essential (primary) hypertension: Secondary | ICD-10-CM | POA: Diagnosis not present

## 2023-01-16 DIAGNOSIS — R5383 Other fatigue: Secondary | ICD-10-CM | POA: Diagnosis not present

## 2023-01-16 DIAGNOSIS — L409 Psoriasis, unspecified: Secondary | ICD-10-CM | POA: Diagnosis not present

## 2023-01-16 DIAGNOSIS — E669 Obesity, unspecified: Secondary | ICD-10-CM | POA: Diagnosis not present

## 2023-01-16 DIAGNOSIS — Z6833 Body mass index (BMI) 33.0-33.9, adult: Secondary | ICD-10-CM | POA: Diagnosis not present

## 2023-01-16 DIAGNOSIS — M2560 Stiffness of unspecified joint, not elsewhere classified: Secondary | ICD-10-CM | POA: Diagnosis not present

## 2023-01-16 DIAGNOSIS — M79671 Pain in right foot: Secondary | ICD-10-CM | POA: Diagnosis not present

## 2023-01-16 DIAGNOSIS — M79642 Pain in left hand: Secondary | ICD-10-CM | POA: Diagnosis not present

## 2023-01-16 DIAGNOSIS — M1991 Primary osteoarthritis, unspecified site: Secondary | ICD-10-CM | POA: Diagnosis not present

## 2023-01-16 DIAGNOSIS — M79672 Pain in left foot: Secondary | ICD-10-CM | POA: Diagnosis not present

## 2023-01-16 DIAGNOSIS — M79641 Pain in right hand: Secondary | ICD-10-CM | POA: Diagnosis not present

## 2023-02-07 DIAGNOSIS — E1169 Type 2 diabetes mellitus with other specified complication: Secondary | ICD-10-CM | POA: Diagnosis not present

## 2023-02-07 DIAGNOSIS — I10 Essential (primary) hypertension: Secondary | ICD-10-CM | POA: Diagnosis not present

## 2023-02-07 DIAGNOSIS — M2550 Pain in unspecified joint: Secondary | ICD-10-CM | POA: Diagnosis not present

## 2023-02-07 DIAGNOSIS — J449 Chronic obstructive pulmonary disease, unspecified: Secondary | ICD-10-CM | POA: Diagnosis not present

## 2023-02-07 DIAGNOSIS — L409 Psoriasis, unspecified: Secondary | ICD-10-CM | POA: Diagnosis not present

## 2023-02-07 DIAGNOSIS — I77811 Abdominal aortic ectasia: Secondary | ICD-10-CM | POA: Diagnosis not present

## 2023-02-07 DIAGNOSIS — I700 Atherosclerosis of aorta: Secondary | ICD-10-CM | POA: Diagnosis not present

## 2023-02-07 DIAGNOSIS — R2689 Other abnormalities of gait and mobility: Secondary | ICD-10-CM | POA: Diagnosis not present

## 2023-02-07 DIAGNOSIS — R1110 Vomiting, unspecified: Secondary | ICD-10-CM | POA: Diagnosis not present

## 2023-02-07 DIAGNOSIS — J8410 Pulmonary fibrosis, unspecified: Secondary | ICD-10-CM | POA: Diagnosis not present

## 2023-02-07 DIAGNOSIS — H9190 Unspecified hearing loss, unspecified ear: Secondary | ICD-10-CM | POA: Diagnosis not present

## 2023-02-07 DIAGNOSIS — T7840XA Allergy, unspecified, initial encounter: Secondary | ICD-10-CM | POA: Diagnosis not present

## 2023-02-15 DIAGNOSIS — R5383 Other fatigue: Secondary | ICD-10-CM | POA: Diagnosis not present

## 2023-02-15 DIAGNOSIS — M79641 Pain in right hand: Secondary | ICD-10-CM | POA: Diagnosis not present

## 2023-02-15 DIAGNOSIS — M1991 Primary osteoarthritis, unspecified site: Secondary | ICD-10-CM | POA: Diagnosis not present

## 2023-02-15 DIAGNOSIS — M79672 Pain in left foot: Secondary | ICD-10-CM | POA: Diagnosis not present

## 2023-02-15 DIAGNOSIS — M2560 Stiffness of unspecified joint, not elsewhere classified: Secondary | ICD-10-CM | POA: Diagnosis not present

## 2023-02-15 DIAGNOSIS — M79671 Pain in right foot: Secondary | ICD-10-CM | POA: Diagnosis not present

## 2023-02-15 DIAGNOSIS — L409 Psoriasis, unspecified: Secondary | ICD-10-CM | POA: Diagnosis not present

## 2023-02-15 DIAGNOSIS — Z6833 Body mass index (BMI) 33.0-33.9, adult: Secondary | ICD-10-CM | POA: Diagnosis not present

## 2023-02-15 DIAGNOSIS — E669 Obesity, unspecified: Secondary | ICD-10-CM | POA: Diagnosis not present

## 2023-02-15 DIAGNOSIS — M79642 Pain in left hand: Secondary | ICD-10-CM | POA: Diagnosis not present

## 2023-03-15 DIAGNOSIS — H353132 Nonexudative age-related macular degeneration, bilateral, intermediate dry stage: Secondary | ICD-10-CM | POA: Diagnosis not present

## 2023-05-08 DIAGNOSIS — E1169 Type 2 diabetes mellitus with other specified complication: Secondary | ICD-10-CM | POA: Diagnosis not present

## 2023-05-08 DIAGNOSIS — K219 Gastro-esophageal reflux disease without esophagitis: Secondary | ICD-10-CM | POA: Diagnosis not present

## 2023-05-08 DIAGNOSIS — Z125 Encounter for screening for malignant neoplasm of prostate: Secondary | ICD-10-CM | POA: Diagnosis not present

## 2023-05-08 DIAGNOSIS — I700 Atherosclerosis of aorta: Secondary | ICD-10-CM | POA: Diagnosis not present

## 2023-05-08 DIAGNOSIS — I10 Essential (primary) hypertension: Secondary | ICD-10-CM | POA: Diagnosis not present

## 2023-05-09 DIAGNOSIS — E1169 Type 2 diabetes mellitus with other specified complication: Secondary | ICD-10-CM | POA: Diagnosis not present

## 2023-05-15 DIAGNOSIS — J8410 Pulmonary fibrosis, unspecified: Secondary | ICD-10-CM | POA: Diagnosis not present

## 2023-05-15 DIAGNOSIS — Z0000 Encounter for general adult medical examination without abnormal findings: Secondary | ICD-10-CM | POA: Diagnosis not present

## 2023-05-15 DIAGNOSIS — R2689 Other abnormalities of gait and mobility: Secondary | ICD-10-CM | POA: Diagnosis not present

## 2023-05-15 DIAGNOSIS — J449 Chronic obstructive pulmonary disease, unspecified: Secondary | ICD-10-CM | POA: Diagnosis not present

## 2023-05-15 DIAGNOSIS — M2550 Pain in unspecified joint: Secondary | ICD-10-CM | POA: Diagnosis not present

## 2023-05-15 DIAGNOSIS — I10 Essential (primary) hypertension: Secondary | ICD-10-CM | POA: Diagnosis not present

## 2023-05-15 DIAGNOSIS — E1169 Type 2 diabetes mellitus with other specified complication: Secondary | ICD-10-CM | POA: Diagnosis not present

## 2023-05-15 DIAGNOSIS — Z1331 Encounter for screening for depression: Secondary | ICD-10-CM | POA: Diagnosis not present

## 2023-05-15 DIAGNOSIS — I77811 Abdominal aortic ectasia: Secondary | ICD-10-CM | POA: Diagnosis not present

## 2023-05-15 DIAGNOSIS — I700 Atherosclerosis of aorta: Secondary | ICD-10-CM | POA: Diagnosis not present

## 2023-05-15 DIAGNOSIS — L409 Psoriasis, unspecified: Secondary | ICD-10-CM | POA: Diagnosis not present

## 2023-05-15 DIAGNOSIS — Z1339 Encounter for screening examination for other mental health and behavioral disorders: Secondary | ICD-10-CM | POA: Diagnosis not present

## 2023-08-07 ENCOUNTER — Other Ambulatory Visit: Payer: Self-pay | Admitting: Thoracic Surgery (Cardiothoracic Vascular Surgery)

## 2023-08-07 DIAGNOSIS — R918 Other nonspecific abnormal finding of lung field: Secondary | ICD-10-CM

## 2023-08-14 DIAGNOSIS — D225 Melanocytic nevi of trunk: Secondary | ICD-10-CM | POA: Diagnosis not present

## 2023-08-14 DIAGNOSIS — L821 Other seborrheic keratosis: Secondary | ICD-10-CM | POA: Diagnosis not present

## 2023-08-14 DIAGNOSIS — L400 Psoriasis vulgaris: Secondary | ICD-10-CM | POA: Diagnosis not present

## 2023-08-14 DIAGNOSIS — L814 Other melanin hyperpigmentation: Secondary | ICD-10-CM | POA: Diagnosis not present

## 2023-08-15 DIAGNOSIS — N400 Enlarged prostate without lower urinary tract symptoms: Secondary | ICD-10-CM | POA: Diagnosis not present

## 2023-08-15 DIAGNOSIS — N401 Enlarged prostate with lower urinary tract symptoms: Secondary | ICD-10-CM | POA: Diagnosis not present

## 2023-08-15 DIAGNOSIS — J449 Chronic obstructive pulmonary disease, unspecified: Secondary | ICD-10-CM | POA: Diagnosis not present

## 2023-08-15 DIAGNOSIS — I10 Essential (primary) hypertension: Secondary | ICD-10-CM | POA: Diagnosis not present

## 2023-08-15 DIAGNOSIS — M2550 Pain in unspecified joint: Secondary | ICD-10-CM | POA: Diagnosis not present

## 2023-08-15 DIAGNOSIS — I77811 Abdominal aortic ectasia: Secondary | ICD-10-CM | POA: Diagnosis not present

## 2023-08-15 DIAGNOSIS — T7840XA Allergy, unspecified, initial encounter: Secondary | ICD-10-CM | POA: Diagnosis not present

## 2023-08-15 DIAGNOSIS — L409 Psoriasis, unspecified: Secondary | ICD-10-CM | POA: Diagnosis not present

## 2023-08-15 DIAGNOSIS — N5201 Erectile dysfunction due to arterial insufficiency: Secondary | ICD-10-CM | POA: Diagnosis not present

## 2023-08-15 DIAGNOSIS — K580 Irritable bowel syndrome with diarrhea: Secondary | ICD-10-CM | POA: Diagnosis not present

## 2023-08-15 DIAGNOSIS — R3912 Poor urinary stream: Secondary | ICD-10-CM | POA: Diagnosis not present

## 2023-08-15 DIAGNOSIS — H9190 Unspecified hearing loss, unspecified ear: Secondary | ICD-10-CM | POA: Diagnosis not present

## 2023-08-15 DIAGNOSIS — J8410 Pulmonary fibrosis, unspecified: Secondary | ICD-10-CM | POA: Diagnosis not present

## 2023-08-15 DIAGNOSIS — E1169 Type 2 diabetes mellitus with other specified complication: Secondary | ICD-10-CM | POA: Diagnosis not present

## 2023-08-15 DIAGNOSIS — I700 Atherosclerosis of aorta: Secondary | ICD-10-CM | POA: Diagnosis not present

## 2023-09-11 ENCOUNTER — Ambulatory Visit: Payer: Medicare Other | Admitting: Thoracic Surgery (Cardiothoracic Vascular Surgery)

## 2023-09-11 ENCOUNTER — Other Ambulatory Visit: Payer: Medicare Other

## 2023-09-20 ENCOUNTER — Ambulatory Visit
Admission: RE | Admit: 2023-09-20 | Discharge: 2023-09-20 | Disposition: A | Discharge status: Home / Self Care | Payer: Medicare Other | Source: Ambulatory Visit | Attending: Thoracic Surgery (Cardiothoracic Vascular Surgery) | Admitting: Thoracic Surgery (Cardiothoracic Vascular Surgery)

## 2023-09-20 DIAGNOSIS — R918 Other nonspecific abnormal finding of lung field: Secondary | ICD-10-CM | POA: Diagnosis not present

## 2023-09-25 ENCOUNTER — Ambulatory Visit (INDEPENDENT_AMBULATORY_CARE_PROVIDER_SITE_OTHER): Payer: Medicare Other | Admitting: Thoracic Surgery (Cardiothoracic Vascular Surgery)

## 2023-09-25 ENCOUNTER — Encounter: Payer: Self-pay | Admitting: Thoracic Surgery (Cardiothoracic Vascular Surgery)

## 2023-09-25 VITALS — BP 130/73 | HR 73 | Resp 18 | Ht 74.0 in | Wt 228.0 lb

## 2023-09-25 DIAGNOSIS — R918 Other nonspecific abnormal finding of lung field: Secondary | ICD-10-CM | POA: Diagnosis not present

## 2023-09-25 NOTE — Progress Notes (Signed)
[address]       [place] [address]             [phone]     HPI: Joseph Decker returns for a scheduled follow-up visit for lung cancer screening.  Joseph Decker is a 77 year old male with a history of tobacco abuse, anxiety, PTSD, Agent Orange exposure, degenerative joint disease, psoriasis, peripheral neuropathy, lumbar disc disease, and granulomatous disease.  He was found to have a lung nodule on a low-dose CT for lung cancer screening in 2019.  Wedge resection showed granulomatous disease.  He has a 40-pack-year history of smoking so we continue to follow him.  Still rides his bike on a regular basis.  It is an electronic assisted bike.  Overall he is feeling well.  Shortness of breath with heavy activity but no recent change in his respiratory status.  Past Medical History:  Diagnosis Date   Agent orange exposure    Arthritis    CAD (coronary artery disease)    coronary calcification on CT 04/10/17   Cataract    COPD (chronic obstructive pulmonary disease) (HCC)    emphysema   DDD (degenerative disc disease), lumbar    Ectatic abdominal aorta (HCC)    04/10/17 U/S: 2.7 cm abdominal aorta, recommend rescreening in 5 years (Dr. Jeanella Cara)   Emphysema of lung (HCC)    Enlarged prostate    GERD (gastroesophageal reflux disease)    Hyperglycemia    Hyperlipidemia    Hypertension    Macrocytosis without anemia    Narcolepsy    pt states he is self diagnosed   Peripheral neuropathy    Pneumonia    during his service in [place]   Pre-diabetes    Psoriasis    PTSD (post-traumatic stress disorder)    [place] war veteran   Sinus headache     Current Outpatient Medications  Medication Sig Dispense Refill   acetaminophen (TYLENOL) 325 MG tablet Take 650 mg by mouth every 6 (six) hours as needed for mild pain.     albuterol (VENTOLIN HFA) 108 (90 Base) MCG/ACT inhaler Inhale 2 puffs into the lungs as needed for cough.     ALPRAZolam (XANAX) 0.5 MG tablet  Take 0.5 mg by mouth daily as needed for anxiety.     Biotin 5000 MCG TABS Take 5,000 mcg by mouth daily.     Blood Glucose Monitoring Suppl (FREESTYLE LITE) DEVI Apply 1 application topically daily.     calcipotriene (DOVONOX) 0.005 % cream Apply 1 application topically 2 (two) times daily as needed (for elbows).     Calcium Carb-Cholecalciferol (CALCIUM 600+D3 PO) Take 1 tablet by mouth daily.     Cholecalciferol (VITAMIN D-3) 5000 units TABS Take 5,000 Units by mouth every other day.     clobetasol (OLUX) 0.05 % topical foam Apply 1 application topically 2 (two) times daily as needed (for itching scalp).     famotidine (PEPCID) 20 MG tablet Take 20 mg by mouth daily.     ibuprofen (ADVIL,MOTRIN) 200 MG tablet Take 400 mg by mouth every 8 (eight) hours as needed (for pain.).     metFORMIN (GLUCOPHAGE) 500 MG tablet Take by mouth 2 (two) times daily with a meal.     Multiple Vitamin (MULTIVITAMIN WITH MINERALS) TABS tablet Take 1 tablet by mouth daily. 2-3 times a week     Multiple Vitamins-Minerals (PRESERVISION AREDS 2 PO) Take 1 tablet by mouth 2 (two) times daily.  naproxen (EC NAPROSYN) 500 MG EC tablet Take 500 mg by mouth daily as needed (for pain.).     Omega-3 Fatty Acids (EQL OMEGA 3 FISH OIL) 1400 MG CAPS Take 1,400 mg by mouth daily.     Polyethyl Glycol-Propyl Glycol 0.4-0.3 % SOLN Place 1-2 drops into both eyes 3 (three) times daily as needed (for dry/irritated eyes.).     rosuvastatin (CRESTOR) 10 MG tablet Take 10 mg by mouth See admin instructions. TAKE 1 TABLET (10 MG) BY MOUTH 5 TIMES A WEEK IN THE EVENING     simethicone (MYLICON) 80 MG chewable tablet Chew by mouth.     sitaGLIPtin (JANUVIA) 100 MG tablet Take 100 mg by mouth daily.     tamsulosin (FLOMAX) 0.4 MG CAPS capsule Take 0.4 mg by mouth daily after supper. 30 minutes after supper     triamcinolone (KENALOG) 0.025 % ointment Apply 1 application topically 2 (two) times daily as needed (for psoriasis (skin  breakdown)).     vitamin E 180 MG (400 UNITS) capsule Take 400 Units by mouth daily.     No current facility-administered medications for this visit.    Physical Exam BP 130/73   Pulse 73   Resp 18   Ht 6\' 2"  (1.88 m)   Wt 228 lb (103.4 kg)   SpO2 96% Comment: RA  BMI 29.16 kg/m  77 year old man in no acute distress Alert and oriented x 3 with no focal deficits Lungs diminished but otherwise clear bilaterally Cardiac regular rate and rhythm No peripheral edema  Diagnostic Tests: CT CHEST WITHOUT CONTRAST   TECHNIQUE: Multidetector CT imaging of the chest was performed following the standard protocol without IV contrast.   RADIATION DOSE REDUCTION: This exam was performed according to the departmental dose-optimization program which includes automated exposure control, adjustment of the mA and/or kV according to patient size and/or use of iterative reconstruction technique.   COMPARISON:  08/29/2022   FINDINGS: Cardiovascular: The heart size is normal. No substantial pericardial effusion. Coronary artery calcification is evident. Mild atherosclerotic calcification is noted in the wall of the thoracic aorta. Ascending thoracic aorta measures 4.0 cm diameter.   Mediastinum/Nodes: No mediastinal lymphadenopathy. 6 mm short axis high right paratracheal node on 31/2 has decreased in the interval. Upper normal subcarinal lymph node is similar to prior. No evidence for gross hilar lymphadenopathy although assessment is limited by the lack of intravenous contrast on the current study. The esophagus has normal imaging features. There is no axillary lymphadenopathy.   Lungs/Pleura: Centrilobular and paraseptal emphysema evident. Surgical scarring in the upper right lung is stable. No new suspicious pulmonary nodule or mass. No focal airspace consolidation. No pleural effusion.   Upper Abdomen: The liver shows diffusely decreased attenuation suggesting fat deposition.  Visualized abdomen otherwise unremarkable.   Musculoskeletal: No worrisome lytic or sclerotic osseous abnormality.   IMPRESSION: 1. Stable exam. Postoperative changes in the right lung. No new or progressive findings. 2. Hepatic steatosis. 3. Aortic Atherosclerosis (ICD10-I70.0) and Emphysema (ICD10-J43.9).     Electronically Signed   By: Kennith Center M.D.   On: 09/20/2023 11:51   I personally reviewed the CT images.  No change from previous exam.  No suspicious lung nodules.  Aortic atherosclerosis and coronary calcification.  Emphysema.  Impression: Joseph Decker is a 77 year old male with a history of tobacco abuse, anxiety, PTSD, Agent Orange exposure, degenerative joint disease, psoriasis, peripheral neuropathy, lumbar disc disease, and granulomatous disease.  Tobacco abuse-quit smoking in 2010.  40-pack-year history  prior to that.  Needs continued annual CT scanning for lung cancer screening.  Emphysema-symptoms well-controlled.  Ascending aortic ectasia-appears to be about 3.8 cm to my measurement.  There is some significant calcification in his aorta.  Plan: Return in 1 year with CT chest  I spent over 20 minutes in review of records, images, and in consultation with Joseph Decker today.  Loreli Slot, MD [facility] [phone]

## 2023-10-10 DIAGNOSIS — Z23 Encounter for immunization: Secondary | ICD-10-CM | POA: Diagnosis not present

## 2023-10-24 DIAGNOSIS — Z23 Encounter for immunization: Secondary | ICD-10-CM | POA: Diagnosis not present

## 2023-11-28 DIAGNOSIS — G4700 Insomnia, unspecified: Secondary | ICD-10-CM | POA: Diagnosis not present

## 2023-11-28 DIAGNOSIS — R519 Headache, unspecified: Secondary | ICD-10-CM | POA: Diagnosis not present

## 2023-11-28 DIAGNOSIS — J8410 Pulmonary fibrosis, unspecified: Secondary | ICD-10-CM | POA: Diagnosis not present

## 2023-11-28 DIAGNOSIS — E1169 Type 2 diabetes mellitus with other specified complication: Secondary | ICD-10-CM | POA: Diagnosis not present

## 2023-11-28 DIAGNOSIS — N400 Enlarged prostate without lower urinary tract symptoms: Secondary | ICD-10-CM | POA: Diagnosis not present

## 2023-11-28 DIAGNOSIS — I10 Essential (primary) hypertension: Secondary | ICD-10-CM | POA: Diagnosis not present

## 2023-11-28 DIAGNOSIS — K580 Irritable bowel syndrome with diarrhea: Secondary | ICD-10-CM | POA: Diagnosis not present

## 2023-11-28 DIAGNOSIS — J449 Chronic obstructive pulmonary disease, unspecified: Secondary | ICD-10-CM | POA: Diagnosis not present

## 2023-11-28 DIAGNOSIS — I77811 Abdominal aortic ectasia: Secondary | ICD-10-CM | POA: Diagnosis not present

## 2023-11-28 DIAGNOSIS — M2550 Pain in unspecified joint: Secondary | ICD-10-CM | POA: Diagnosis not present

## 2023-11-28 DIAGNOSIS — I700 Atherosclerosis of aorta: Secondary | ICD-10-CM | POA: Diagnosis not present

## 2024-03-18 DIAGNOSIS — G4733 Obstructive sleep apnea (adult) (pediatric): Secondary | ICD-10-CM | POA: Diagnosis not present

## 2024-03-18 DIAGNOSIS — G4700 Insomnia, unspecified: Secondary | ICD-10-CM | POA: Diagnosis not present

## 2024-03-18 DIAGNOSIS — R519 Headache, unspecified: Secondary | ICD-10-CM | POA: Diagnosis not present

## 2024-03-18 DIAGNOSIS — J449 Chronic obstructive pulmonary disease, unspecified: Secondary | ICD-10-CM | POA: Diagnosis not present

## 2024-03-18 DIAGNOSIS — J8410 Pulmonary fibrosis, unspecified: Secondary | ICD-10-CM | POA: Diagnosis not present

## 2024-03-18 DIAGNOSIS — M2550 Pain in unspecified joint: Secondary | ICD-10-CM | POA: Diagnosis not present

## 2024-03-18 DIAGNOSIS — I10 Essential (primary) hypertension: Secondary | ICD-10-CM | POA: Diagnosis not present

## 2024-03-18 DIAGNOSIS — I700 Atherosclerosis of aorta: Secondary | ICD-10-CM | POA: Diagnosis not present

## 2024-03-18 DIAGNOSIS — E1169 Type 2 diabetes mellitus with other specified complication: Secondary | ICD-10-CM | POA: Diagnosis not present

## 2024-03-18 DIAGNOSIS — Z713 Dietary counseling and surveillance: Secondary | ICD-10-CM | POA: Diagnosis not present

## 2024-03-18 DIAGNOSIS — T7840XA Allergy, unspecified, initial encounter: Secondary | ICD-10-CM | POA: Diagnosis not present

## 2024-03-18 DIAGNOSIS — E669 Obesity, unspecified: Secondary | ICD-10-CM | POA: Diagnosis not present

## 2024-04-29 DIAGNOSIS — H353132 Nonexudative age-related macular degeneration, bilateral, intermediate dry stage: Secondary | ICD-10-CM | POA: Diagnosis not present

## 2024-06-04 DIAGNOSIS — I10 Essential (primary) hypertension: Secondary | ICD-10-CM | POA: Diagnosis not present

## 2024-06-04 DIAGNOSIS — K219 Gastro-esophageal reflux disease without esophagitis: Secondary | ICD-10-CM | POA: Diagnosis not present

## 2024-06-04 DIAGNOSIS — E1169 Type 2 diabetes mellitus with other specified complication: Secondary | ICD-10-CM | POA: Diagnosis not present

## 2024-06-04 DIAGNOSIS — N400 Enlarged prostate without lower urinary tract symptoms: Secondary | ICD-10-CM | POA: Diagnosis not present

## 2024-06-10 DIAGNOSIS — R82998 Other abnormal findings in urine: Secondary | ICD-10-CM | POA: Diagnosis not present

## 2024-06-10 DIAGNOSIS — E1169 Type 2 diabetes mellitus with other specified complication: Secondary | ICD-10-CM | POA: Diagnosis not present

## 2024-06-10 DIAGNOSIS — G4733 Obstructive sleep apnea (adult) (pediatric): Secondary | ICD-10-CM | POA: Diagnosis not present

## 2024-06-10 DIAGNOSIS — I700 Atherosclerosis of aorta: Secondary | ICD-10-CM | POA: Diagnosis not present

## 2024-06-10 DIAGNOSIS — Z1339 Encounter for screening examination for other mental health and behavioral disorders: Secondary | ICD-10-CM | POA: Diagnosis not present

## 2024-06-10 DIAGNOSIS — H9190 Unspecified hearing loss, unspecified ear: Secondary | ICD-10-CM | POA: Diagnosis not present

## 2024-06-10 DIAGNOSIS — L409 Psoriasis, unspecified: Secondary | ICD-10-CM | POA: Diagnosis not present

## 2024-06-10 DIAGNOSIS — E669 Obesity, unspecified: Secondary | ICD-10-CM | POA: Diagnosis not present

## 2024-06-10 DIAGNOSIS — M2550 Pain in unspecified joint: Secondary | ICD-10-CM | POA: Diagnosis not present

## 2024-06-10 DIAGNOSIS — M25511 Pain in right shoulder: Secondary | ICD-10-CM | POA: Diagnosis not present

## 2024-06-10 DIAGNOSIS — J8410 Pulmonary fibrosis, unspecified: Secondary | ICD-10-CM | POA: Diagnosis not present

## 2024-06-10 DIAGNOSIS — Z0000 Encounter for general adult medical examination without abnormal findings: Secondary | ICD-10-CM | POA: Diagnosis not present

## 2024-06-10 DIAGNOSIS — N400 Enlarged prostate without lower urinary tract symptoms: Secondary | ICD-10-CM | POA: Diagnosis not present

## 2024-06-10 DIAGNOSIS — J449 Chronic obstructive pulmonary disease, unspecified: Secondary | ICD-10-CM | POA: Diagnosis not present

## 2024-06-10 DIAGNOSIS — I10 Essential (primary) hypertension: Secondary | ICD-10-CM | POA: Diagnosis not present

## 2024-06-10 DIAGNOSIS — Z1331 Encounter for screening for depression: Secondary | ICD-10-CM | POA: Diagnosis not present

## 2024-06-18 DIAGNOSIS — M25511 Pain in right shoulder: Secondary | ICD-10-CM | POA: Diagnosis not present

## 2024-08-08 DIAGNOSIS — R29898 Other symptoms and signs involving the musculoskeletal system: Secondary | ICD-10-CM | POA: Diagnosis not present

## 2024-08-08 DIAGNOSIS — M25511 Pain in right shoulder: Secondary | ICD-10-CM | POA: Diagnosis not present

## 2024-08-12 DIAGNOSIS — R29898 Other symptoms and signs involving the musculoskeletal system: Secondary | ICD-10-CM | POA: Diagnosis not present

## 2024-08-12 DIAGNOSIS — M25511 Pain in right shoulder: Secondary | ICD-10-CM | POA: Diagnosis not present

## 2024-08-14 DIAGNOSIS — M25511 Pain in right shoulder: Secondary | ICD-10-CM | POA: Diagnosis not present

## 2024-08-14 DIAGNOSIS — R29898 Other symptoms and signs involving the musculoskeletal system: Secondary | ICD-10-CM | POA: Diagnosis not present

## 2024-08-18 DIAGNOSIS — N401 Enlarged prostate with lower urinary tract symptoms: Secondary | ICD-10-CM | POA: Diagnosis not present

## 2024-08-18 DIAGNOSIS — R3912 Poor urinary stream: Secondary | ICD-10-CM | POA: Diagnosis not present

## 2024-08-18 DIAGNOSIS — E349 Endocrine disorder, unspecified: Secondary | ICD-10-CM | POA: Diagnosis not present

## 2024-08-21 DIAGNOSIS — R29898 Other symptoms and signs involving the musculoskeletal system: Secondary | ICD-10-CM | POA: Diagnosis not present

## 2024-08-21 DIAGNOSIS — M25511 Pain in right shoulder: Secondary | ICD-10-CM | POA: Diagnosis not present

## 2024-08-27 DIAGNOSIS — M25511 Pain in right shoulder: Secondary | ICD-10-CM | POA: Diagnosis not present

## 2024-08-27 DIAGNOSIS — R29898 Other symptoms and signs involving the musculoskeletal system: Secondary | ICD-10-CM | POA: Diagnosis not present

## 2024-09-05 ENCOUNTER — Other Ambulatory Visit: Payer: Self-pay | Admitting: Thoracic Surgery (Cardiothoracic Vascular Surgery)

## 2024-09-05 DIAGNOSIS — R918 Other nonspecific abnormal finding of lung field: Secondary | ICD-10-CM

## 2024-09-08 DIAGNOSIS — Z23 Encounter for immunization: Secondary | ICD-10-CM | POA: Diagnosis not present

## 2024-09-08 DIAGNOSIS — I10 Essential (primary) hypertension: Secondary | ICD-10-CM | POA: Diagnosis not present

## 2024-09-08 DIAGNOSIS — E669 Obesity, unspecified: Secondary | ICD-10-CM | POA: Diagnosis not present

## 2024-09-08 DIAGNOSIS — J449 Chronic obstructive pulmonary disease, unspecified: Secondary | ICD-10-CM | POA: Diagnosis not present

## 2024-09-08 DIAGNOSIS — G4733 Obstructive sleep apnea (adult) (pediatric): Secondary | ICD-10-CM | POA: Diagnosis not present

## 2024-09-08 DIAGNOSIS — M2550 Pain in unspecified joint: Secondary | ICD-10-CM | POA: Diagnosis not present

## 2024-09-08 DIAGNOSIS — M25511 Pain in right shoulder: Secondary | ICD-10-CM | POA: Diagnosis not present

## 2024-09-08 DIAGNOSIS — L409 Psoriasis, unspecified: Secondary | ICD-10-CM | POA: Diagnosis not present

## 2024-09-08 DIAGNOSIS — K580 Irritable bowel syndrome with diarrhea: Secondary | ICD-10-CM | POA: Diagnosis not present

## 2024-09-08 DIAGNOSIS — I700 Atherosclerosis of aorta: Secondary | ICD-10-CM | POA: Diagnosis not present

## 2024-09-08 DIAGNOSIS — G4700 Insomnia, unspecified: Secondary | ICD-10-CM | POA: Diagnosis not present

## 2024-09-08 DIAGNOSIS — E1169 Type 2 diabetes mellitus with other specified complication: Secondary | ICD-10-CM | POA: Diagnosis not present

## 2024-09-11 DIAGNOSIS — E349 Endocrine disorder, unspecified: Secondary | ICD-10-CM | POA: Diagnosis not present

## 2024-09-15 DIAGNOSIS — L2989 Other pruritus: Secondary | ICD-10-CM | POA: Diagnosis not present

## 2024-09-15 DIAGNOSIS — L821 Other seborrheic keratosis: Secondary | ICD-10-CM | POA: Diagnosis not present

## 2024-09-15 DIAGNOSIS — L738 Other specified follicular disorders: Secondary | ICD-10-CM | POA: Diagnosis not present

## 2024-09-15 DIAGNOSIS — D1801 Hemangioma of skin and subcutaneous tissue: Secondary | ICD-10-CM | POA: Diagnosis not present

## 2024-09-15 DIAGNOSIS — L400 Psoriasis vulgaris: Secondary | ICD-10-CM | POA: Diagnosis not present

## 2024-09-15 DIAGNOSIS — L814 Other melanin hyperpigmentation: Secondary | ICD-10-CM | POA: Diagnosis not present

## 2024-09-15 DIAGNOSIS — D225 Melanocytic nevi of trunk: Secondary | ICD-10-CM | POA: Diagnosis not present

## 2024-09-23 ENCOUNTER — Ambulatory Visit (HOSPITAL_COMMUNITY)
Admission: RE | Admit: 2024-09-23 | Discharge: 2024-09-23 | Disposition: A | Discharge status: Home / Self Care | Source: Ambulatory Visit | Attending: Thoracic Surgery (Cardiothoracic Vascular Surgery) | Admitting: Thoracic Surgery (Cardiothoracic Vascular Surgery)

## 2024-09-23 DIAGNOSIS — R918 Other nonspecific abnormal finding of lung field: Secondary | ICD-10-CM

## 2024-09-23 DIAGNOSIS — I700 Atherosclerosis of aorta: Secondary | ICD-10-CM | POA: Insufficient documentation

## 2024-09-23 DIAGNOSIS — K5730 Diverticulosis of large intestine without perforation or abscess without bleeding: Secondary | ICD-10-CM | POA: Diagnosis not present

## 2024-09-23 DIAGNOSIS — J432 Centrilobular emphysema: Secondary | ICD-10-CM | POA: Diagnosis not present

## 2024-09-23 DIAGNOSIS — J439 Emphysema, unspecified: Secondary | ICD-10-CM | POA: Diagnosis not present

## 2024-09-23 DIAGNOSIS — I2510 Atherosclerotic heart disease of native coronary artery without angina pectoris: Secondary | ICD-10-CM | POA: Insufficient documentation

## 2024-10-07 ENCOUNTER — Ambulatory Visit: Admitting: Thoracic Surgery (Cardiothoracic Vascular Surgery)

## 2024-10-14 ENCOUNTER — Ambulatory Visit
Attending: Thoracic Surgery (Cardiothoracic Vascular Surgery) | Admitting: Thoracic Surgery (Cardiothoracic Vascular Surgery)

## 2024-10-14 VITALS — BP 134/80 | HR 82 | Resp 20 | Wt 235.0 lb

## 2024-10-14 DIAGNOSIS — R918 Other nonspecific abnormal finding of lung field: Secondary | ICD-10-CM | POA: Insufficient documentation

## 2024-10-14 NOTE — Progress Notes (Signed)
 [address] [place] [address]             [phone]   HPI: Mr. Genis returns for a scheduled follow-up visit due to his smoking history.  Joseph Decker is a 78 year old male with a history of tobacco use, anxiety, PTSD, agent orange exposure, generative joint disease, psoriasis, peripheral neuropathy, lumbar disc disease, type 2 diabetes, coronary calcification on CT, and granulomatous lung disease.  40-pack-year history of smoking prior to quitting.  Because of his smoking history he has had low-dose CTs for lung cancer screening.  In 2019 he was found to have a lung nodule.  Wedge resection was performed and pathology showed granulomatous disease.  Overall he is feeling well.  Has had a lot of issues with his blood sugar.  Was recently started on but this in addition to metformin and Januvia.  Has been careful with watching his diet.  No chest pain, pressure, or tightness.  Still rides his e-bike on a regular basis.  Past Medical History:  Diagnosis Date   Agent orange exposure    Arthritis    CAD (coronary artery disease)    coronary calcification on CT 04/10/17   Cataract    COPD (chronic obstructive pulmonary disease) (HCC)    emphysema   DDD (degenerative disc disease), lumbar    Ectatic abdominal aorta    04/10/17 U/S: 2.7 cm abdominal aorta, recommend rescreening in 5 years (Dr. DOROTHA Bergamo)   Emphysema of lung (HCC)    Enlarged prostate    GERD (gastroesophageal reflux disease)    Hyperglycemia    Hyperlipidemia    Hypertension    Macrocytosis without anemia    Narcolepsy    pt states he is self diagnosed   Peripheral neuropathy    Pneumonia    during his service in [place]   Pre-diabetes    Psoriasis    PTSD (post-traumatic stress disorder)    [place] war veteran   Sinus headache     Current Outpatient Medications  Medication Sig Dispense Refill   acetaminophen  (TYLENOL ) 325 MG tablet Take 650 mg by mouth every 6 (six) hours as needed  for mild pain.     albuterol  (VENTOLIN  HFA) 108 (90 Base) MCG/ACT inhaler Inhale 2 puffs into the lungs as needed for cough.     ALPRAZolam  (XANAX ) 0.5 MG tablet Take 0.5 mg by mouth daily as needed for anxiety.     Biotin 5000 MCG TABS Take 5,000 mcg by mouth daily.     Blood Glucose Monitoring Suppl (FREESTYLE LITE) DEVI Apply 1 application topically daily.     calcipotriene (DOVONOX) 0.005 % cream Apply 1 application topically 2 (two) times daily as needed (for elbows).     Calcium  Carb-Cholecalciferol (CALCIUM  600+D3 PO) Take 1 tablet by mouth daily.     Cholecalciferol (VITAMIN D-3) 5000 units TABS Take 5,000 Units by mouth every other day.     clobetasol (OLUX) 0.05 % topical foam Apply 1 application topically 2 (two) times daily as needed (for itching scalp).     famotidine (PEPCID) 20 MG tablet Take 20 mg by mouth daily.     glipiZIDE (GLUCOTROL) 5 MG tablet Take 5 mg by mouth daily before breakfast.     ibuprofen (ADVIL,MOTRIN) 200 MG tablet Take 400 mg by mouth every 8 (eight) hours as needed (for pain.).     metFORMIN (GLUCOPHAGE) 500 MG tablet Take by mouth 2 (two) times daily with a meal.  Multiple Vitamin (MULTIVITAMIN WITH MINERALS) TABS tablet Take 1 tablet by mouth daily. 2-3 times a week     Multiple Vitamins-Minerals (PRESERVISION AREDS 2 PO) Take 1 tablet by mouth 2 (two) times daily.     naproxen (EC NAPROSYN) 500 MG EC tablet Take 500 mg by mouth daily as needed (for pain.).     Omega-3 Fatty Acids (EQL OMEGA 3 FISH OIL) 1400 MG CAPS Take 1,400 mg by mouth daily.     Polyethyl Glycol-Propyl Glycol 0.4-0.3 % SOLN Place 1-2 drops into both eyes 3 (three) times daily as needed (for dry/irritated eyes.).     rosuvastatin  (CRESTOR ) 10 MG tablet Take 10 mg by mouth See admin instructions. TAKE 1 TABLET (10 MG) BY MOUTH 5 TIMES A WEEK IN THE EVENING     simethicone  (MYLICON) 80 MG chewable tablet Chew by mouth.     sitaGLIPtin (JANUVIA) 100 MG tablet Take 100 mg by mouth daily.      tamsulosin  (FLOMAX ) 0.4 MG CAPS capsule Take 0.4 mg by mouth daily after supper. 30 minutes after supper     triamcinolone (KENALOG) 0.025 % ointment Apply 1 application topically 2 (two) times daily as needed (for psoriasis (skin breakdown)).     vitamin E 180 MG (400 UNITS) capsule Take 400 Units by mouth daily.     No current facility-administered medications for this visit.    Physical Exam BP 134/80   Pulse 82   Resp 20   Wt 235 lb (106.6 kg)   SpO2 98% Comment: RA  BMI 30.76 kg/m  78 year old man in no acute distress Alert and oriented x 3 with no focal deficits Well-developed and well-nourished Lungs clear bilaterally Cardiac regular rate and rhythm no murmur No peripheral edema  Diagnostic Tests: CT CHEST WITHOUT CONTRAST   TECHNIQUE: Multidetector CT imaging of the chest was performed following the standard protocol without IV contrast.   RADIATION DOSE REDUCTION: This exam was performed according to the departmental dose-optimization program which includes automated exposure control, adjustment of the mA and/or kV according to patient size and/or use of iterative reconstruction technique.   COMPARISON:  09/20/2023 and 08/29/2022   FINDINGS: Cardiovascular: Heart is normal size. Calcified plaque over the left main, left anterior descending and right coronary arteries. Thoracic aorta is normal in caliber. There is calcified plaque over the descending thoracic aorta. Remaining vascular structures are unremarkable.   Mediastinum/Nodes: No significant mediastinal or hilar adenopathy. Remaining mediastinal structures are unremarkable.   Lungs/Pleura: Lungs are adequately inflated demonstrate moderate centrilobular emphysematous disease. Postoperative change over the right upper lung which is stable. Remainder of the lungs are unchanged. No concerning pulmonary nodules or masses. Calcified granuloma over the left upper lobe. Airways are normal.   Upper  Abdomen: Calcified plaque over the abdominal aorta. Minimal diverticulosis over the colon. No acute findings in the visualized upper abdomen.   Musculoskeletal: No focal abnormality.   IMPRESSION: 1. No acute cardiopulmonary disease. 2. Stable postoperative change over the right upper lung. No concerning pulmonary nodules or masses. 3. Moderate centrilobular emphysematous disease. 4. Aortic atherosclerosis. Atherosclerotic coronary artery disease. 5. Minimal colonic diverticulosis.   Aortic Atherosclerosis (ICD10-I70.0) and Emphysema (ICD10-J43.9).     Electronically Signed   By: Toribio Agreste M.D.   On: 09/23/2024 12:52 I personally reviewed the CT images.  Postoperative changes right upper lobe.  Centrilobular emphysema.  Aortic and coronary atherosclerosis.  Impression: Joseph Decker is a 78 year old male with a history of tobacco use, anxiety, PTSD, agent orange  exposure, generative joint disease, psoriasis, peripheral neuropathy, lumbar disc disease, type 2 diabetes, coronary calcification on CT, and granulomatous lung disease.    History of tobacco abuse-40-pack-year history prior to quitting in 2010.  Low-dose CT shows no evidence of active lung disease.  Has moderate centrilobular emphysema.  Coronary atherosclerosis is asymptomatic.  Plan: Return in 1 year with low-dose CT chest  Elspeth JAYSON Millers, MD [facility] [phone]
